# Patient Record
Sex: Female | Born: 1975 | Race: White | Hispanic: Yes | Marital: Single | Smoking: Never smoker
Health system: Southern US, Community
[De-identification: ages and names within clinical notes are randomized; demographics above are authoritative.]

---

## 1997-10-02 ENCOUNTER — Other Ambulatory Visit: Admission: RE | Admit: 1997-10-02 | Discharge: 1997-10-02 | Payer: Self-pay | Admitting: Obstetrics and Gynecology

## 2000-07-29 ENCOUNTER — Ambulatory Visit (HOSPITAL_COMMUNITY): Admission: RE | Admit: 2000-07-29 | Discharge: 2000-07-29 | Payer: Self-pay | Admitting: *Deleted

## 2000-11-22 ENCOUNTER — Inpatient Hospital Stay (HOSPITAL_COMMUNITY): Admission: AD | Admit: 2000-11-22 | Discharge: 2000-11-24 | Payer: Self-pay | Admitting: Obstetrics & Gynecology

## 2007-10-20 ENCOUNTER — Ambulatory Visit (HOSPITAL_COMMUNITY): Admission: RE | Admit: 2007-10-20 | Discharge: 2007-10-20 | Payer: Self-pay | Admitting: Family Medicine

## 2007-12-01 ENCOUNTER — Ambulatory Visit (HOSPITAL_COMMUNITY): Admission: RE | Admit: 2007-12-01 | Discharge: 2007-12-01 | Payer: Self-pay | Admitting: Family Medicine

## 2007-12-27 ENCOUNTER — Ambulatory Visit (HOSPITAL_COMMUNITY): Admission: RE | Admit: 2007-12-27 | Discharge: 2007-12-27 | Payer: Self-pay | Admitting: Family Medicine

## 2008-01-05 ENCOUNTER — Ambulatory Visit: Payer: Self-pay | Admitting: Obstetrics & Gynecology

## 2008-01-05 ENCOUNTER — Inpatient Hospital Stay (HOSPITAL_COMMUNITY): Admission: AD | Admit: 2008-01-05 | Discharge: 2008-01-07 | Payer: Self-pay | Admitting: Obstetrics & Gynecology

## 2008-01-05 ENCOUNTER — Ambulatory Visit: Payer: Self-pay | Admitting: Family Medicine

## 2009-07-11 IMAGING — US US OB DETAIL+14 WK
1 series · 14 of 28 positions shown · non-contrast
Comparison: none

OBSTETRICAL ULTRASOUND:
 This ultrasound exam was performed in the [HOSPITAL] Ultrasound Department.  The OB US report was generated in the AS system, and faxed to the ordering physician.  This report is also available in [REDACTED] PACS.

[Series 1: us ob detail+14 wk · 0.31mm/px · 73 acquisitions, 14 frames shown]
[im 3/73]
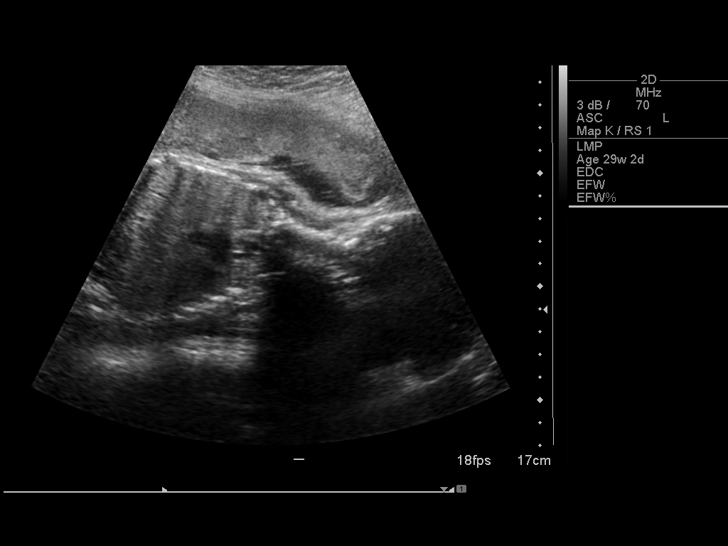
[im 9/73]
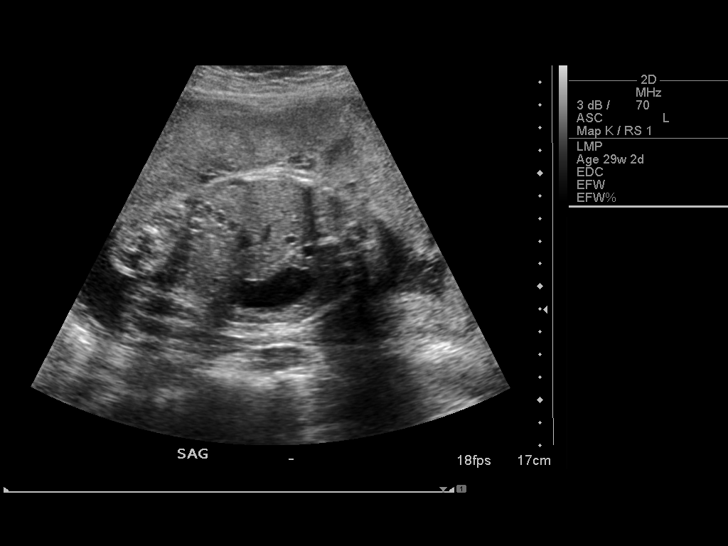
[im 14/73]
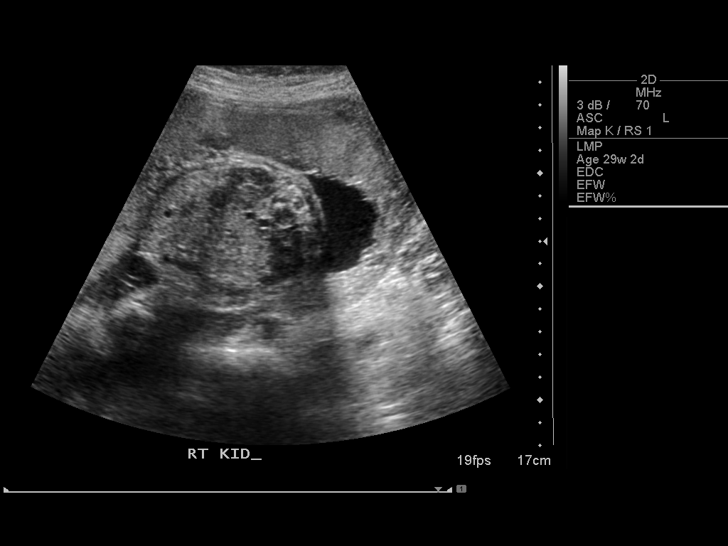
[im 19/73]
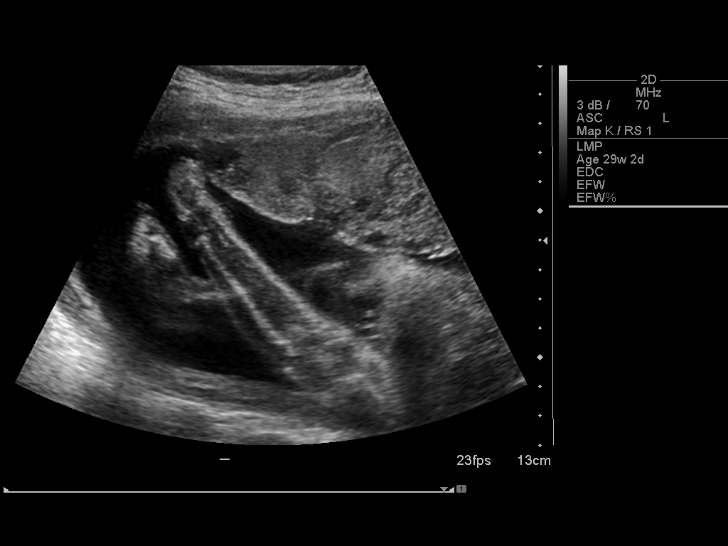
[im 25/73]
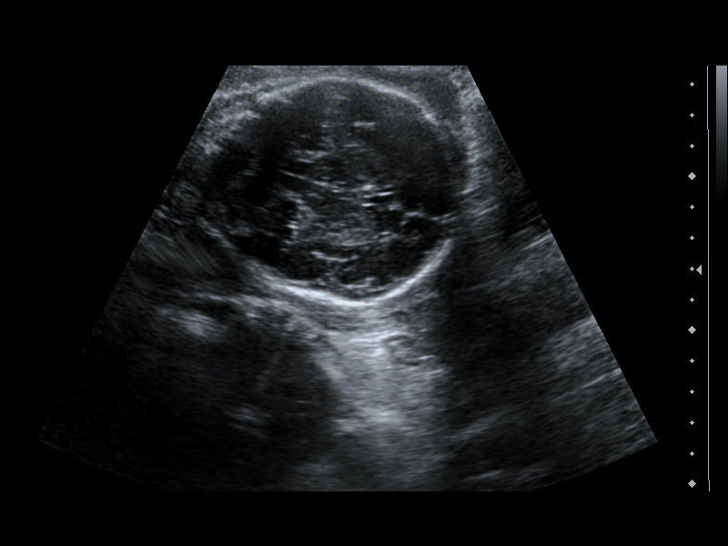
[im 30/73]
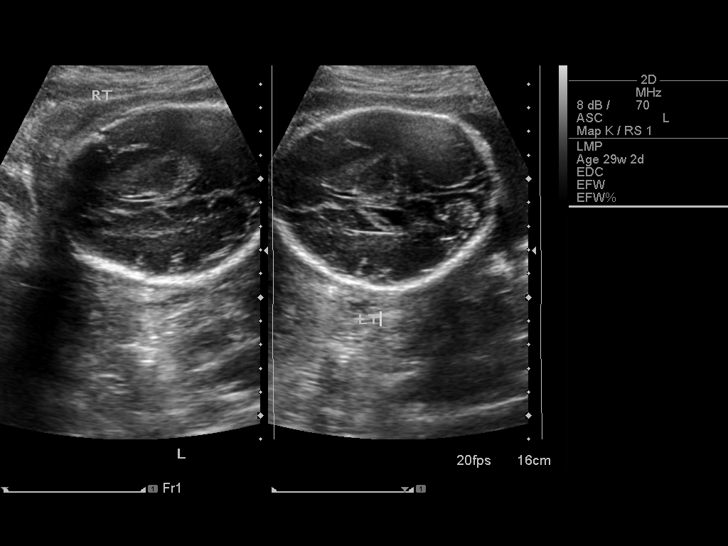
[im 35/73]
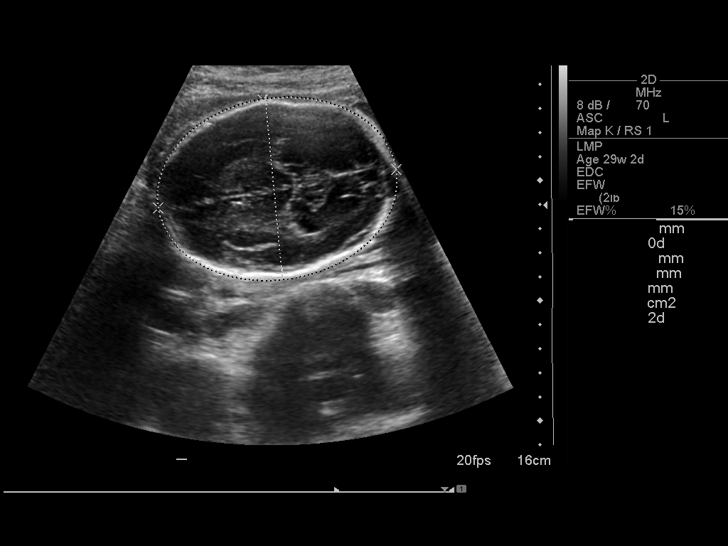
[im 41/73]
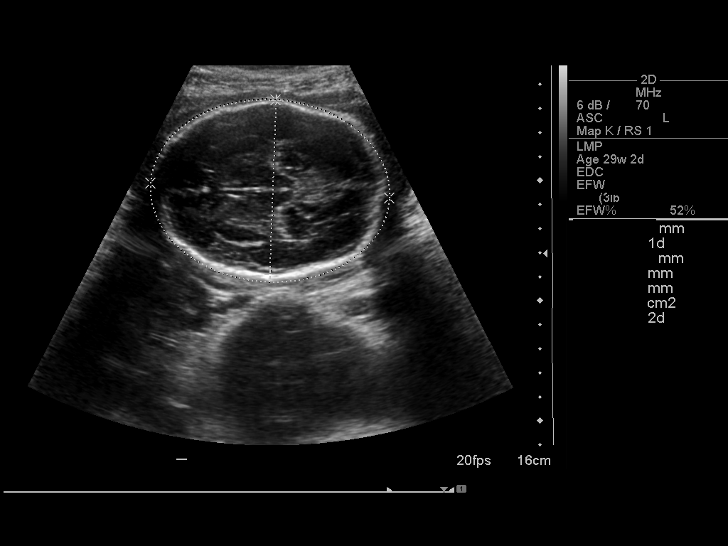
[im 46/73]
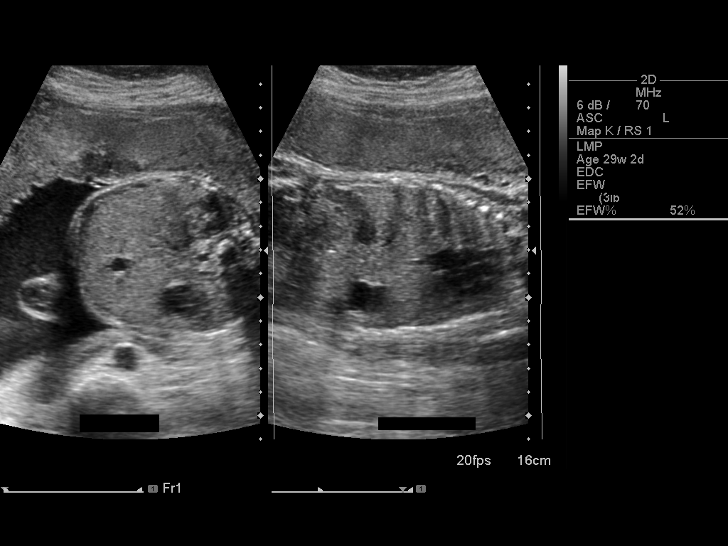
[im 51/73]
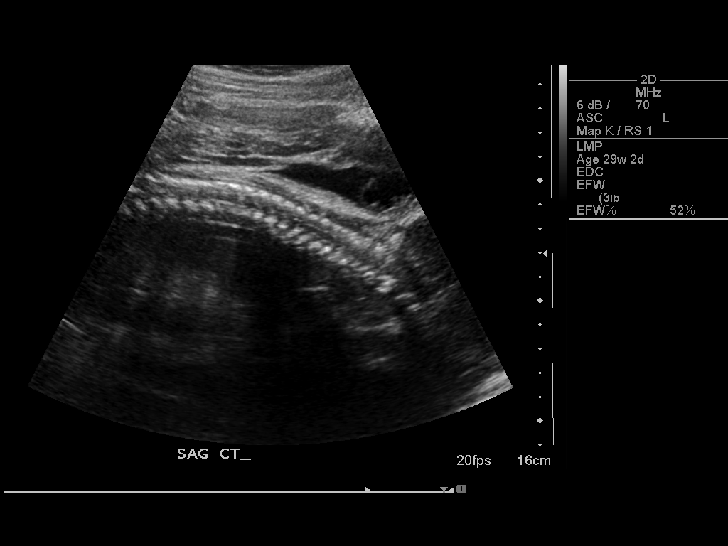
[im 57/73]
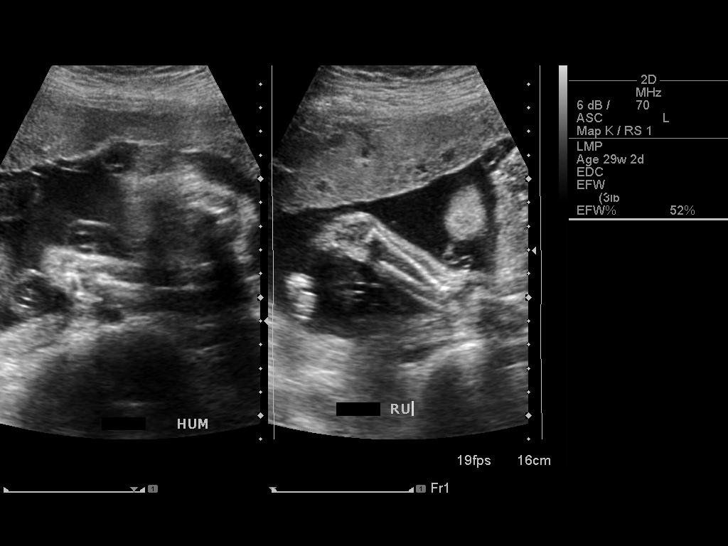
[im 62/73]
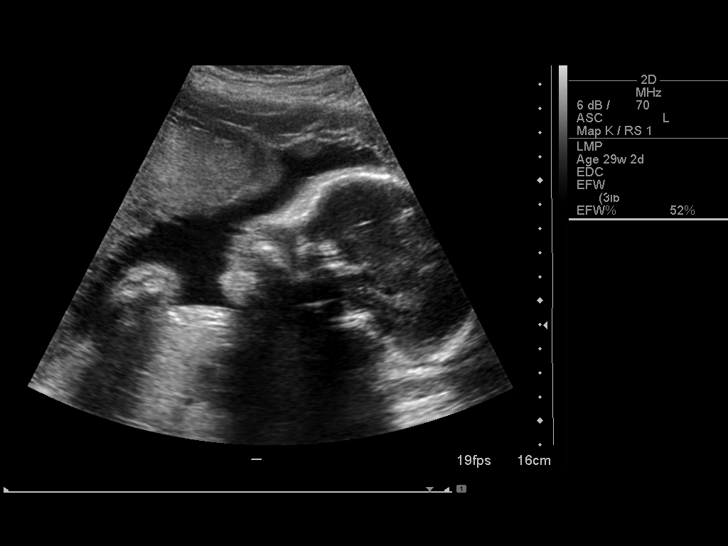
[im 67/73]
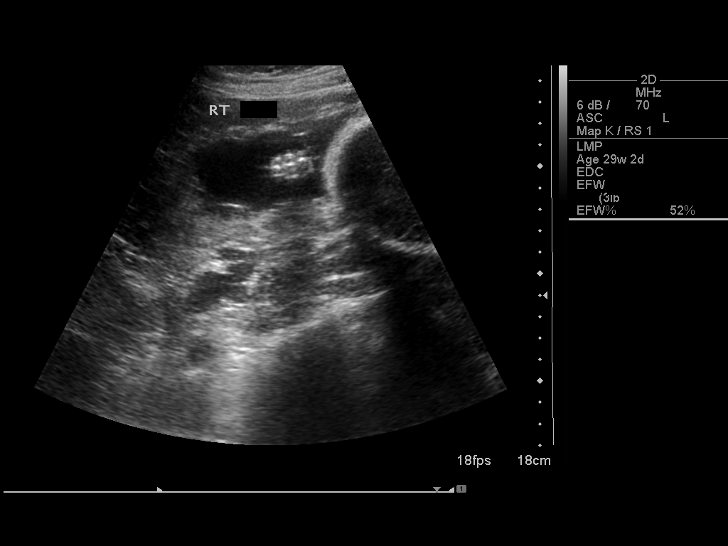
[im 73/73]
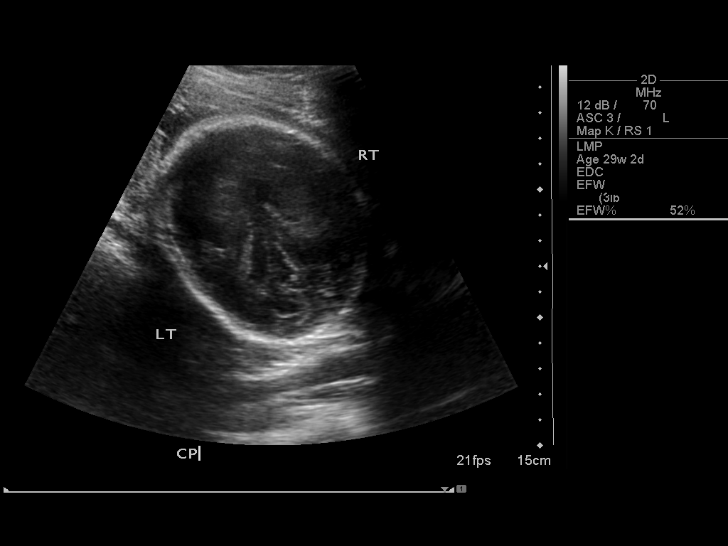

[14 of 28 positions shown; findings below may reference images not displayed]

IMPRESSION: See AS Obstetric US report.

## 2009-08-22 IMAGING — US US OB FOLLOW-UP
1 series · 14 of 26 positions shown · non-contrast
Comparison: none

OBSTETRICAL ULTRASOUND:
 This ultrasound exam was performed in the [HOSPITAL] Ultrasound Department.  The OB US report was generated in the AS system, and faxed to the ordering physician.  This report is also available in [REDACTED] PACS.

[Series 1: us ob re-eval · 14 of 26 slices shown]
[im 1/26]
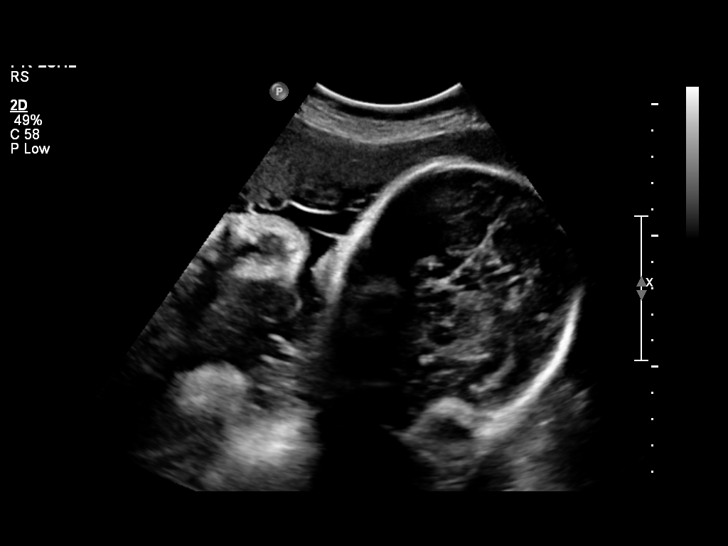
[im 3/26]
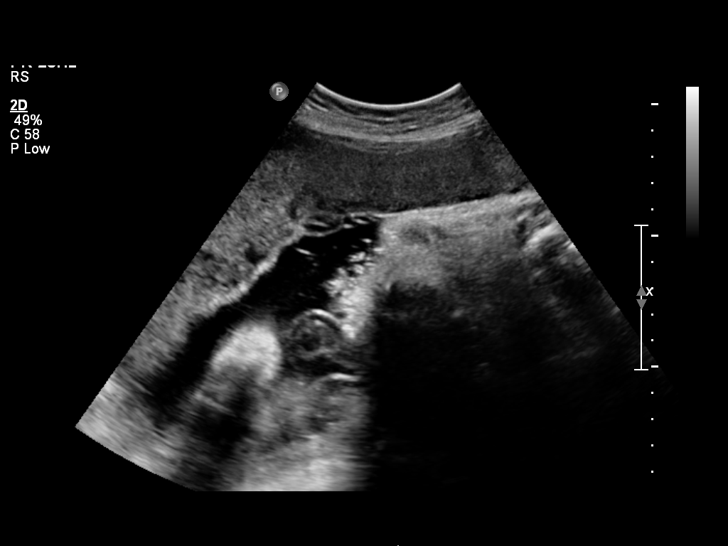
[im 5/26]
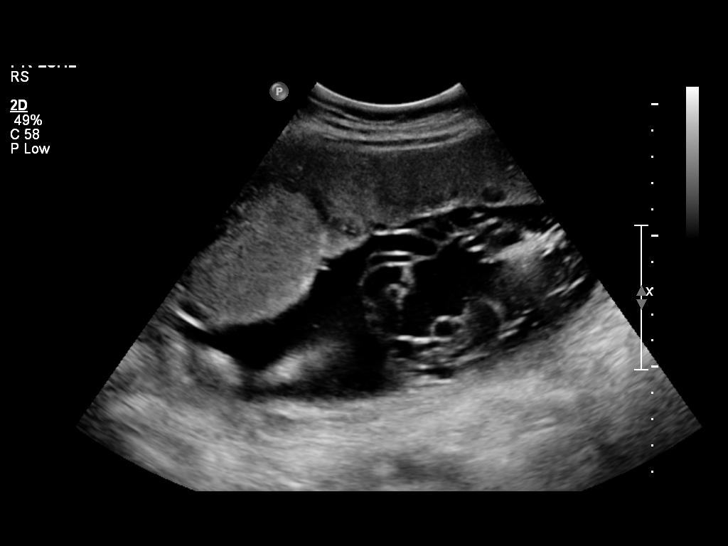
[im 7/26]
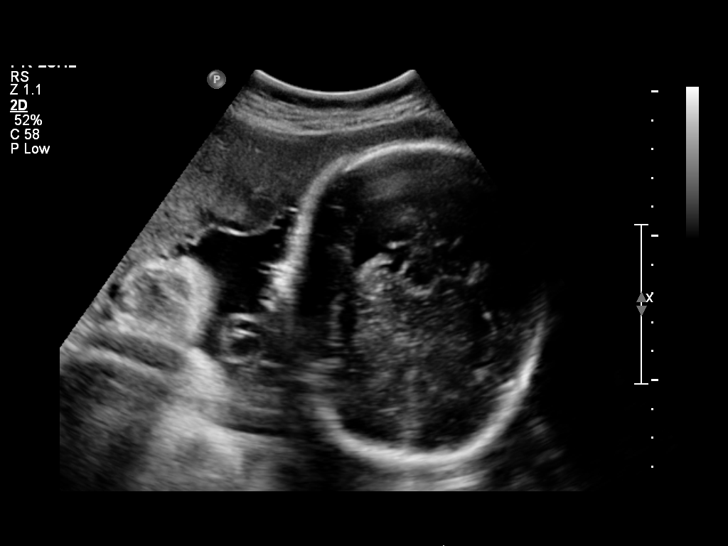
[im 9/26]
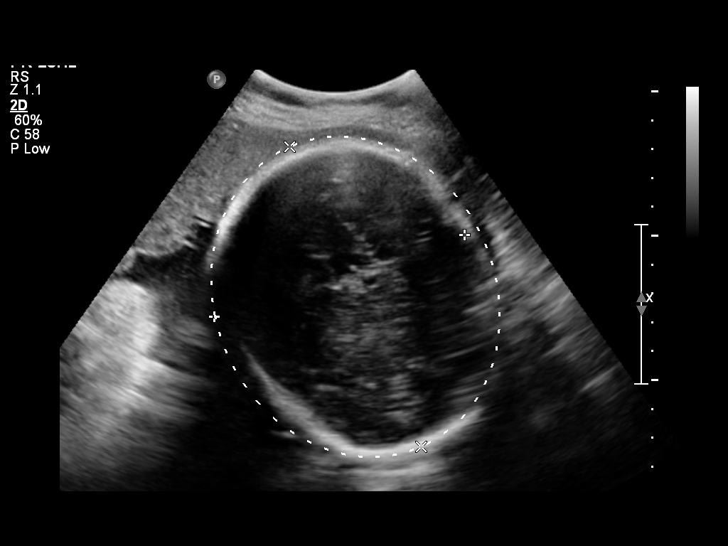
[im 11/26]
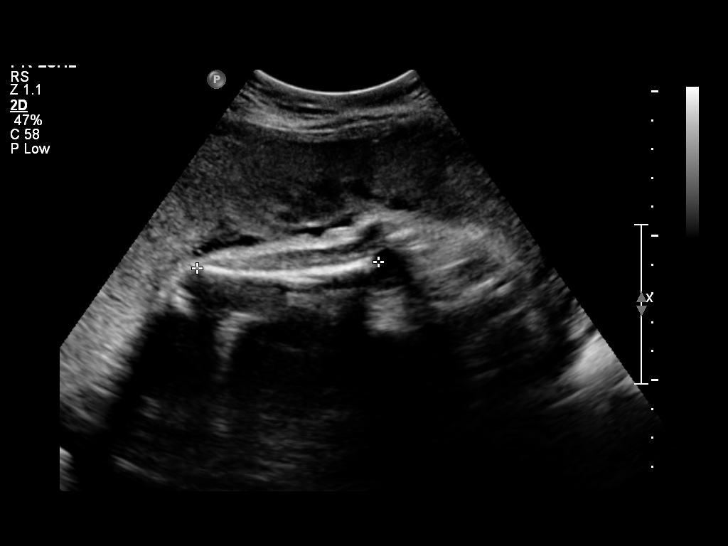
[im 13/26]
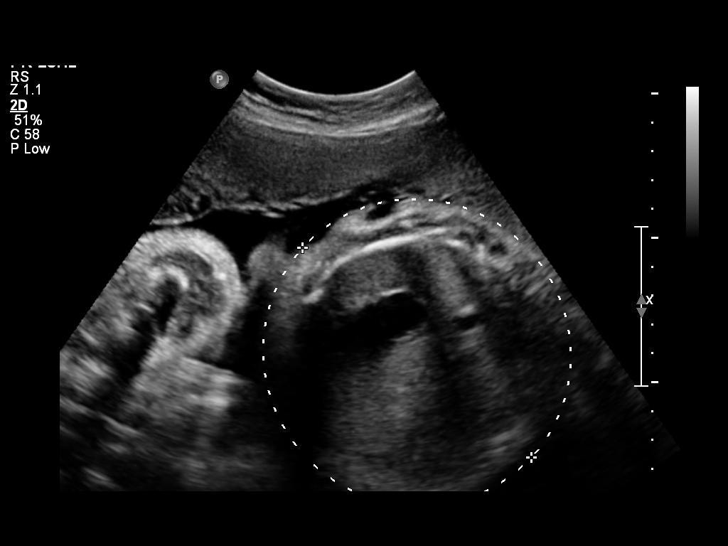
[im 14/26]
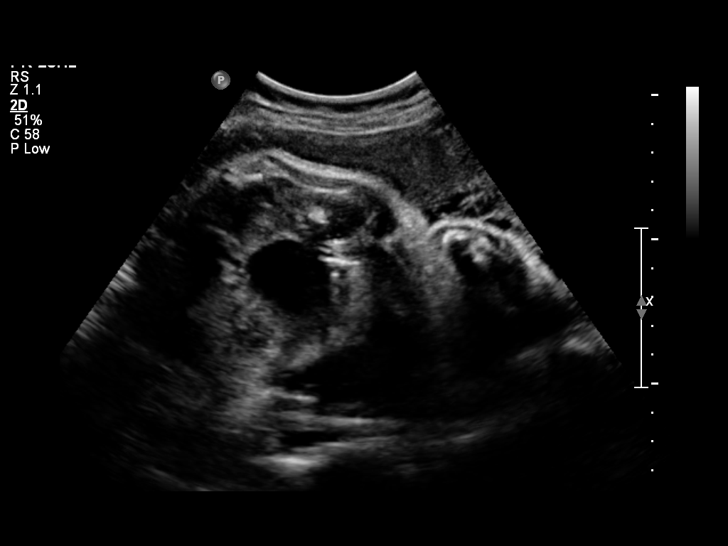
[im 16/26]
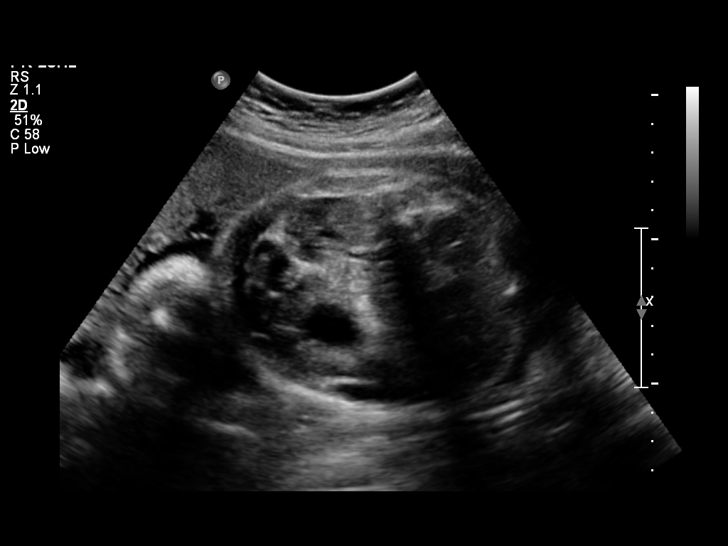
[im 18/26]
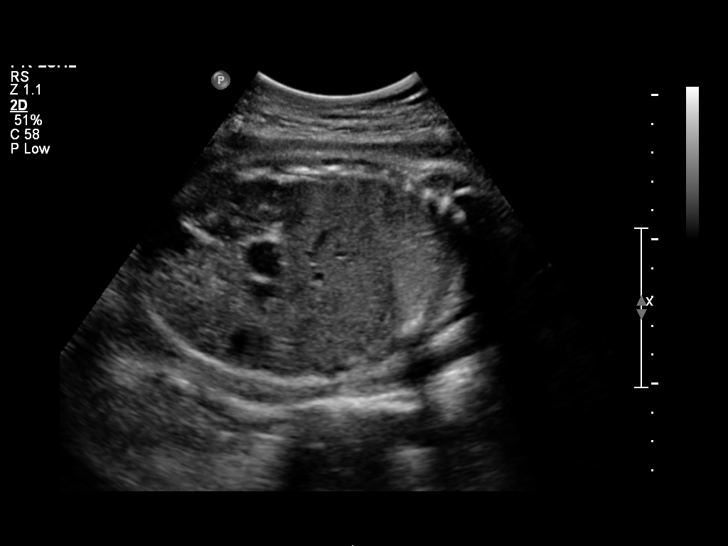
[im 20/26]
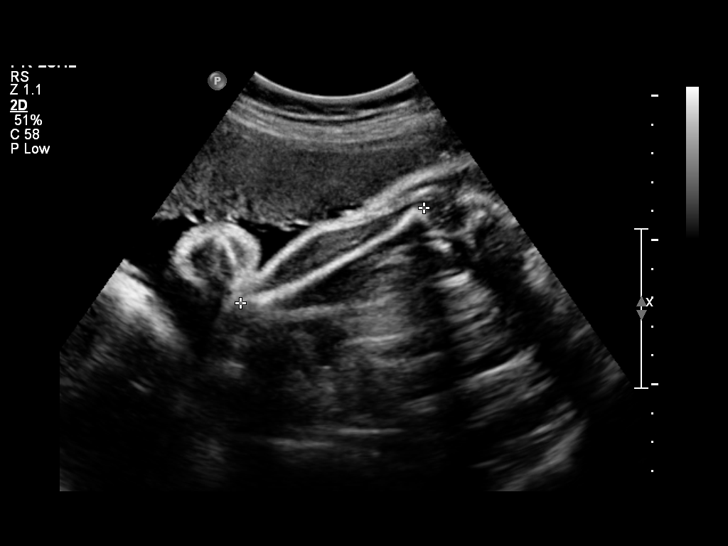
[im 22/26]
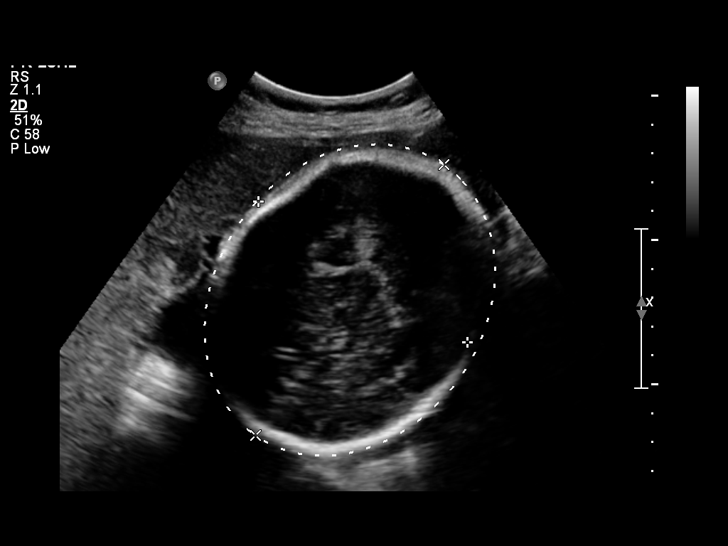
[im 24/26]
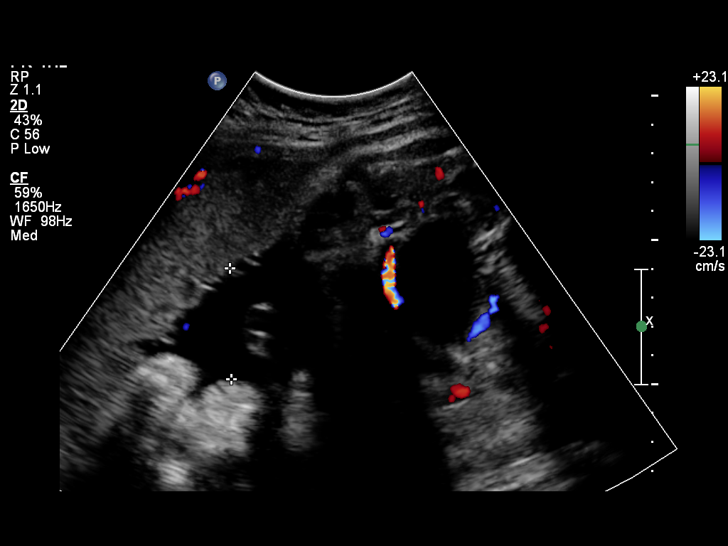
[im 26/26]
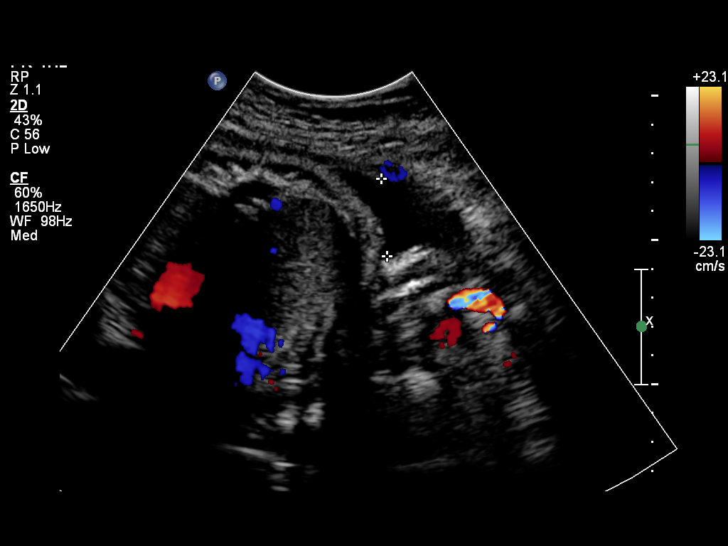

[14 of 26 positions shown; findings below may reference images not displayed]

IMPRESSION: See AS Obstetric US report.

## 2009-09-17 IMAGING — US US OB FOLLOW-UP
1 series · 14 of 24 positions shown · non-contrast
Comparison: none

OBSTETRICAL ULTRASOUND:
 This ultrasound exam was performed in the [HOSPITAL] Ultrasound Department.  The OB US report was generated in the AS system, and faxed to the ordering physician.  This report is also available in [REDACTED] PACS.

[Series 1: us ob follow-up · 0.24mm/px · 14 of 24 slices shown]
[im 1/24]
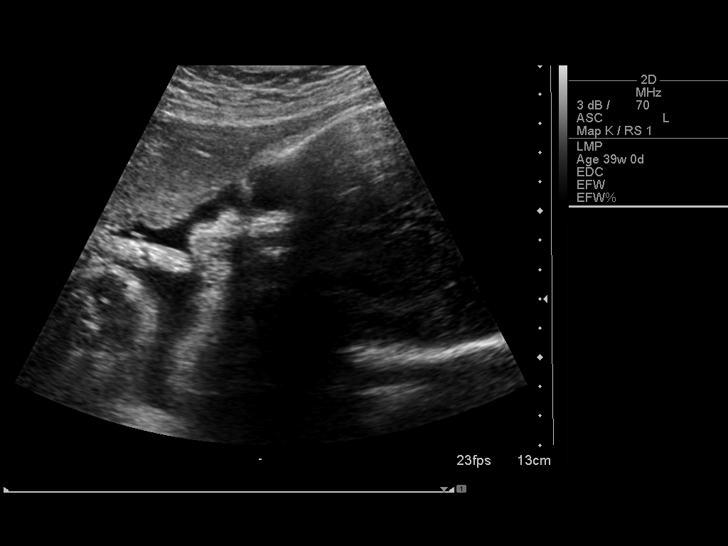
[im 3/24]
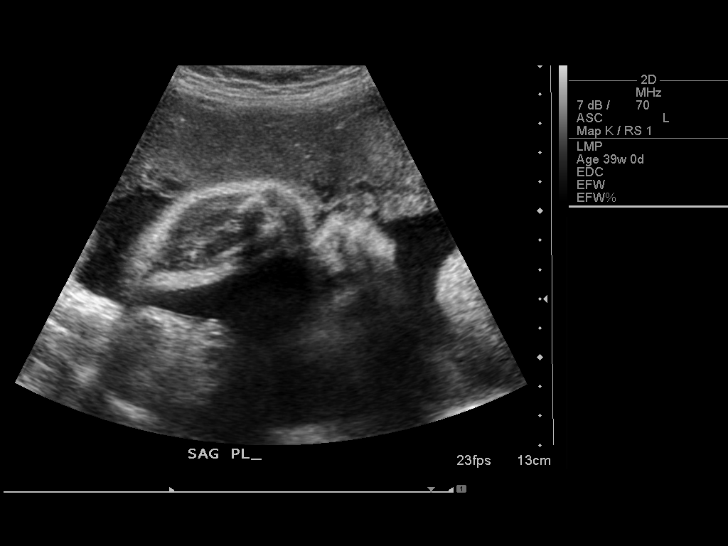
[im 5/24]
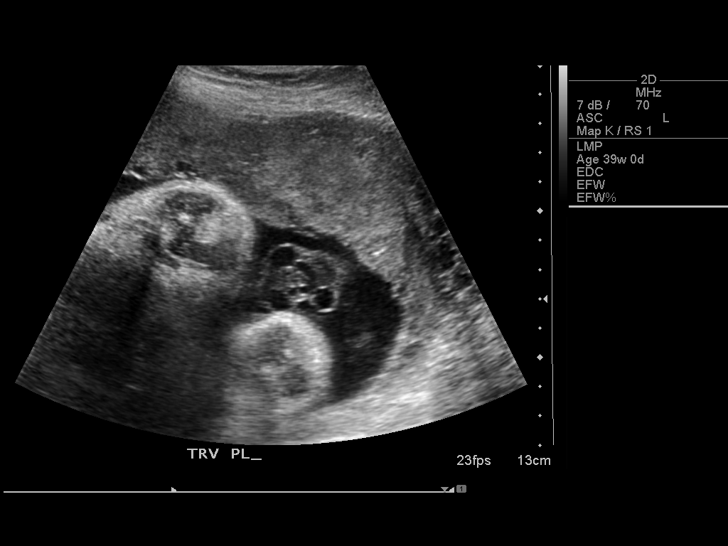
[im 7/24]
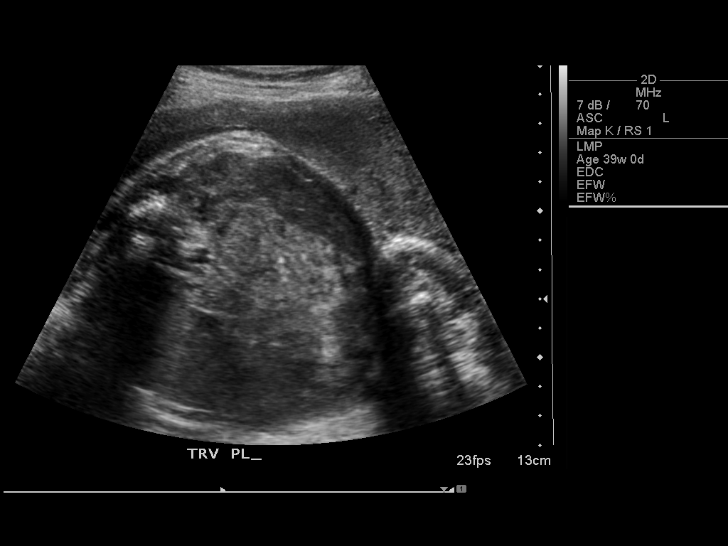
[im 8/24]
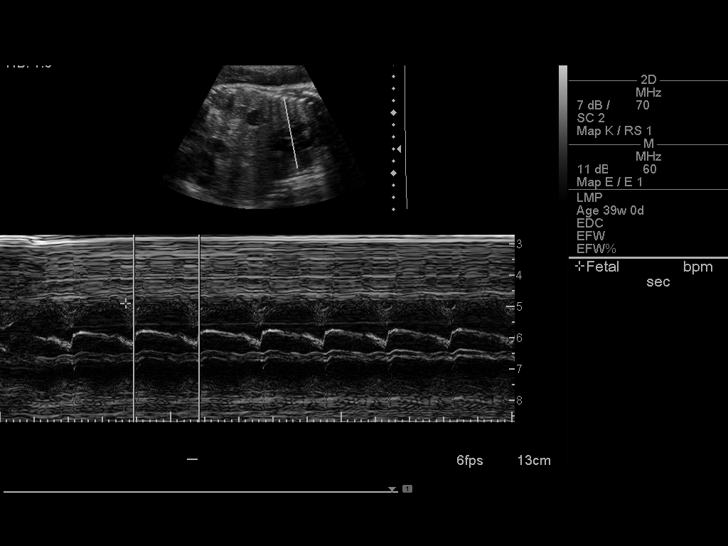
[im 10/24]
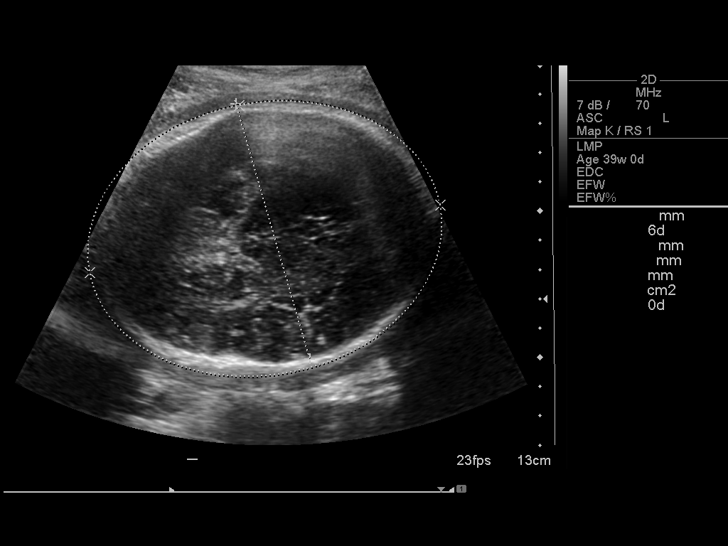
[im 12/24]
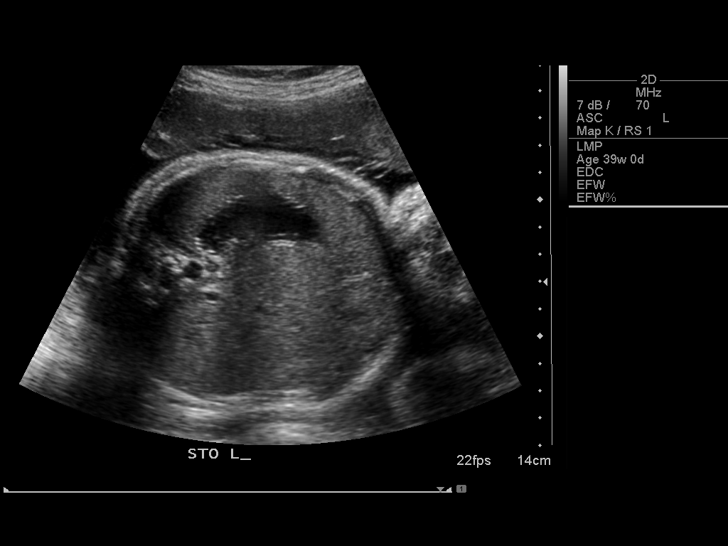
[im 13/24]
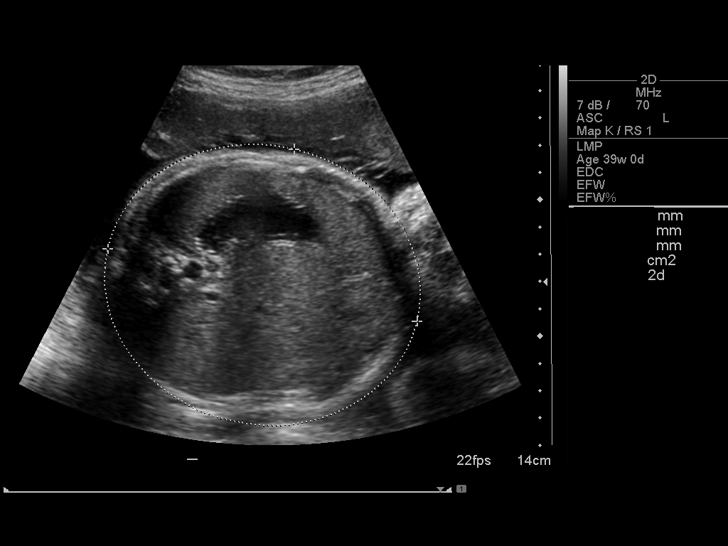
[im 15/24]
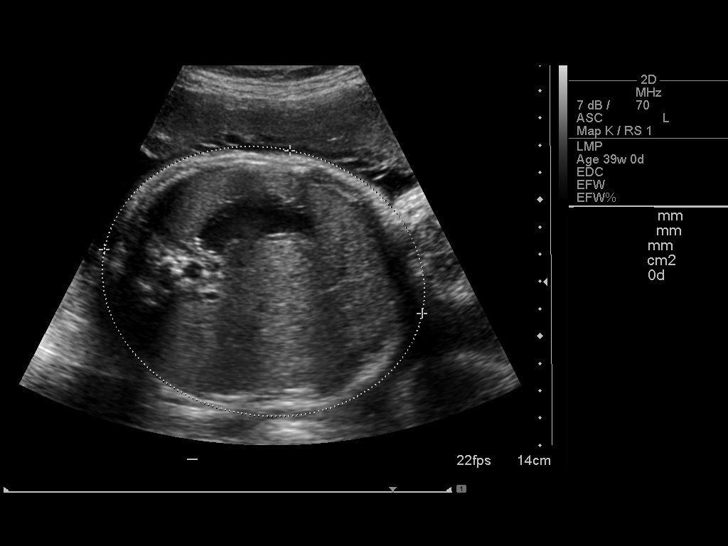
[im 17/24]
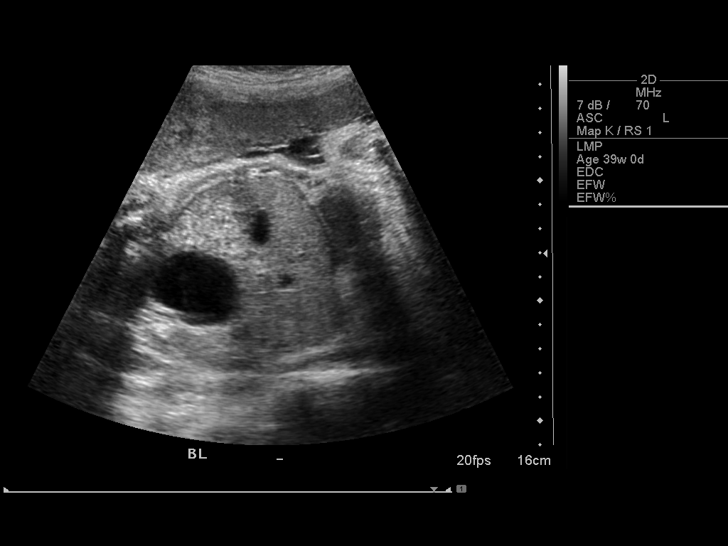
[im 19/24]
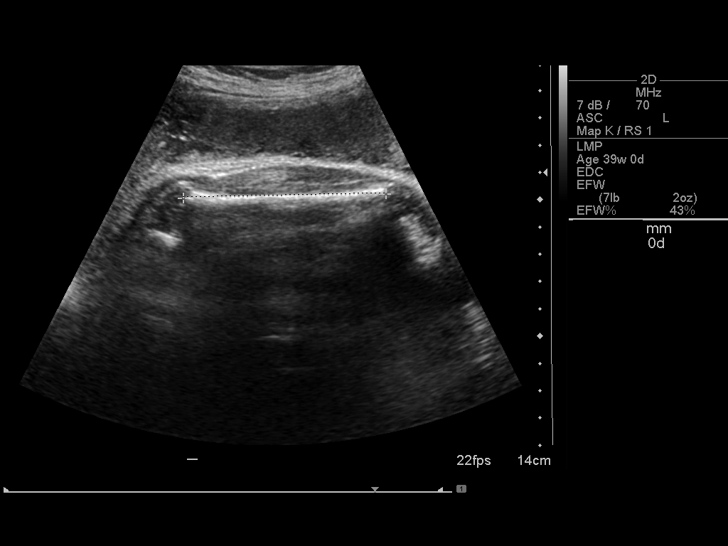
[im 20/24]
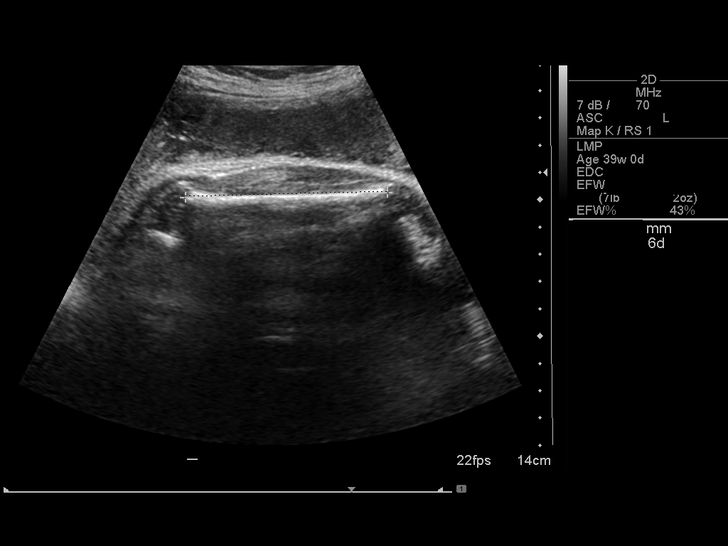
[im 22/24]
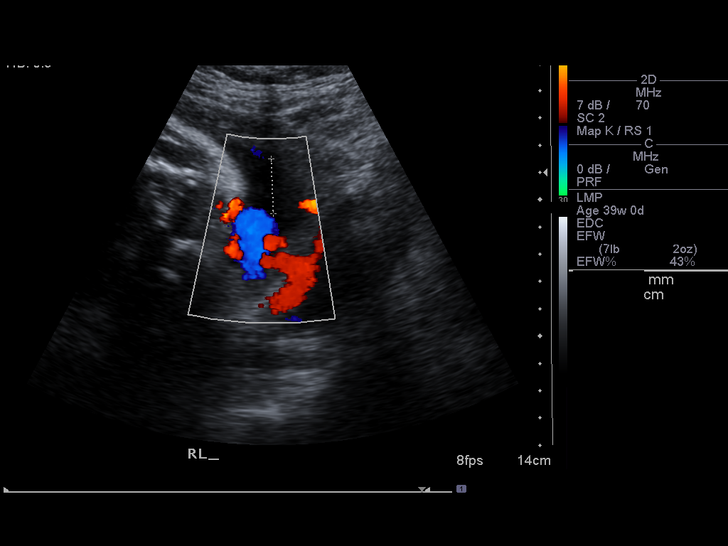
[im 24/24]
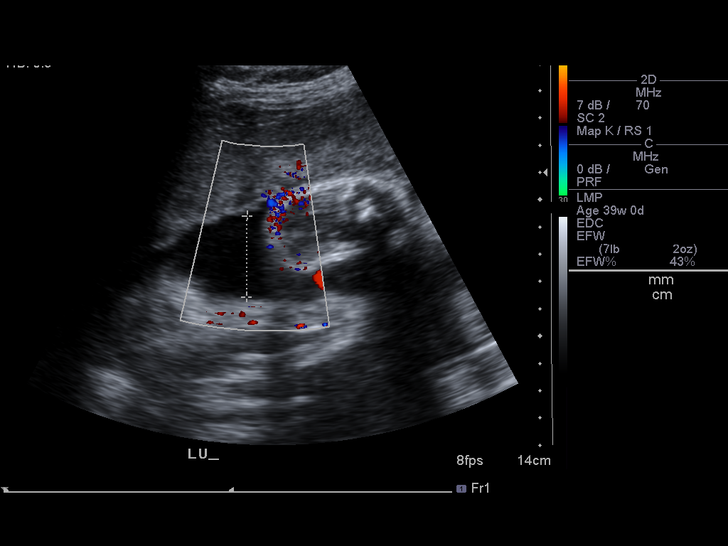

[14 of 24 positions shown; findings below may reference images not displayed]

IMPRESSION: See AS Obstetric US report.

## 2011-01-04 LAB — CBC
HCT: 35.3 — ABNORMAL LOW
HCT: 41.3
Hemoglobin: 12.2
Hemoglobin: 14
MCHC: 34
MCHC: 34.2
MCV: 95.3
MCV: 95.6
Platelets: 183
Platelets: 207
RBC: 3.7 — ABNORMAL LOW
RBC: 4.33
RDW: 12.9
RDW: 13.1
WBC: 17.8 — ABNORMAL HIGH
WBC: 9.7

## 2011-01-04 LAB — RPR: RPR Ser Ql: NONREACTIVE

## 2018-01-31 ENCOUNTER — Other Ambulatory Visit (HOSPITAL_COMMUNITY): Payer: Self-pay | Admitting: *Deleted

## 2018-01-31 DIAGNOSIS — Z1231 Encounter for screening mammogram for malignant neoplasm of breast: Secondary | ICD-10-CM

## 2018-02-06 ENCOUNTER — Other Ambulatory Visit (HOSPITAL_COMMUNITY): Payer: Self-pay | Admitting: *Deleted

## 2018-02-06 DIAGNOSIS — Z1231 Encounter for screening mammogram for malignant neoplasm of breast: Secondary | ICD-10-CM

## 2018-05-11 ENCOUNTER — Encounter (HOSPITAL_COMMUNITY): Payer: Self-pay

## 2018-05-11 ENCOUNTER — Ambulatory Visit
Admission: RE | Admit: 2018-05-11 | Discharge: 2018-05-11 | Disposition: A | Discharge status: Home / Self Care | Payer: No Typology Code available for payment source | Source: Ambulatory Visit | Attending: Obstetrics and Gynecology | Admitting: Obstetrics and Gynecology

## 2018-05-11 ENCOUNTER — Ambulatory Visit: Payer: Self-pay

## 2018-05-11 ENCOUNTER — Ambulatory Visit (HOSPITAL_COMMUNITY)
Admission: RE | Admit: 2018-05-11 | Discharge: 2018-05-11 | Disposition: A | Discharge status: Home / Self Care | Payer: Self-pay | Source: Ambulatory Visit | Attending: Obstetrics and Gynecology | Admitting: Obstetrics and Gynecology

## 2018-05-11 VITALS — BP 112/78 | Wt 157.0 lb

## 2018-05-11 DIAGNOSIS — Z1239 Encounter for other screening for malignant neoplasm of breast: Secondary | ICD-10-CM

## 2018-05-11 DIAGNOSIS — Z1231 Encounter for screening mammogram for malignant neoplasm of breast: Secondary | ICD-10-CM

## 2018-05-11 NOTE — Patient Instructions (Signed)
Explained breast self awareness with [name]. Patient did not need a Pap smear today due to last Pap smear was in May or June 2019 per patient. Let her know BCCCP will cover Pap smears every 3 years unless has a history of abnormal Pap smears. Referred patient to the [facility] Center of [facility] for a screening mammogram. Appointment scheduled for Thursday, May 11, 2018 at 1040. Patient aware of appointment and will be there. Let patient know the Breast Center will follow up with her within the next couple weeks with results of mammogram by letter or phone. [name] verbalized understanding.  Sue Gonzalez, Kathaleen Maser, RN 10:26 AM

## 2018-05-11 NOTE — Progress Notes (Signed)
No complaints today.   Pap Smear: Pap smear not completed today. Last Pap smear was May or June 2019 at the [facility] Department and normal per patient. Per patient has no history of an abnormal Pap smear. No Pap smear results are in Epic.  Physical exam: Breasts Breasts symmetrical. No skin abnormalities bilateral breasts. No nipple retraction bilateral breasts. No nipple discharge bilateral breasts. No lymphadenopathy. No lumps palpated bilateral breasts. No complaints of pain or tenderness on exam. Referred patient to the [facility] of [facility] for a screening mammogram. Appointment scheduled for Thursday, May 11, 2018 at 1040.        Pelvic/Bimanual No Pap smear completed today since last Pap smear was in May or June 2019 per patient. Pap smear not indicated per BCCCP guidelines.   Smoking History: Patient has never smoked.  Patient Navigation: Patient education provided. Access to services provided for patient through [facility] program. Spanish interpreter provided.   Breast and Cervical Cancer Risk Assessment: Patient has no family history of breast cancer, known genetic mutations, or radiation treatment to the chest before age 3. Patient has no history of cervical dysplasia, immunocompromised, or DES exposure in-utero.  Risk Assessment    Risk Scores      05/11/2018   Last edited by: Lynnell Dike, LPN   5-year risk: 0.3 %   Lifetime risk: 5.8 %         Used Spanish interpreter Natale Lay from [place].

## 2018-05-12 ENCOUNTER — Encounter (HOSPITAL_COMMUNITY): Payer: Self-pay | Admitting: *Deleted

## 2019-02-02 ENCOUNTER — Other Ambulatory Visit: Payer: Self-pay

## 2019-02-02 DIAGNOSIS — Z20822 Contact with and (suspected) exposure to covid-19: Secondary | ICD-10-CM

## 2019-02-03 LAB — NOVEL CORONAVIRUS, NAA: SARS-CoV-2, NAA: NOT DETECTED

## 2019-02-17 ENCOUNTER — Other Ambulatory Visit: Payer: Self-pay | Admitting: *Deleted

## 2019-02-17 DIAGNOSIS — Z20822 Contact with and (suspected) exposure to covid-19: Secondary | ICD-10-CM

## 2019-02-20 LAB — NOVEL CORONAVIRUS, NAA: SARS-CoV-2, NAA: NOT DETECTED

## 2019-06-29 ENCOUNTER — Telehealth: Payer: Self-pay

## 2019-07-02 ENCOUNTER — Other Ambulatory Visit: Payer: Self-pay

## 2019-07-02 DIAGNOSIS — Z1231 Encounter for screening mammogram for malignant neoplasm of breast: Secondary | ICD-10-CM

## 2019-07-06 NOTE — Telephone Encounter (Signed)
Error

## 2019-07-26 ENCOUNTER — Ambulatory Visit: Payer: Self-pay | Admitting: Medical

## 2019-07-26 ENCOUNTER — Encounter: Payer: Self-pay | Admitting: Medical

## 2019-07-26 ENCOUNTER — Ambulatory Visit
Admission: RE | Admit: 2019-07-26 | Discharge: 2019-07-26 | Disposition: A | Discharge status: Home / Self Care | Payer: Self-pay | Source: Ambulatory Visit | Attending: Obstetrics and Gynecology | Admitting: Obstetrics and Gynecology

## 2019-07-26 ENCOUNTER — Other Ambulatory Visit: Payer: Self-pay

## 2019-07-26 VITALS — BP 156/74 | Temp 97.5°F | Wt 156.0 lb

## 2019-07-26 DIAGNOSIS — Z1231 Encounter for screening mammogram for malignant neoplasm of breast: Secondary | ICD-10-CM

## 2019-07-26 NOTE — Progress Notes (Signed)
[name] is a 44 y.o. female who presents to [facility] clinic today with no complaints.    Pap Smear: Pap not smear completed today. Last Pap smear was 10/2016 at [facility] and was normal. Per patient has no history of an abnormal Pap smear. Last Pap smear result is not available in Epic.   Physical exam: Breasts Breasts with mild asymmetry, right larger than left. No skin abnormalities bilateral breasts. No nipple retraction bilateral breasts. No nipple discharge bilateral breasts. No lymphadenopathy. No lumps palpated bilateral breasts.       Pelvic/Bimanual Pap is not indicated today    Smoking History: Patient has never smoked.     Patient Navigation: Patient education provided. Access to services provided for patient through [facility] program. Interpreter provided.    Colorectal Cancer Screening: Per patient has never had colonoscopy completed No complaints today.    Breast and Cervical Cancer Risk Assessment: Patient does not have family history of breast cancer, known genetic mutations, or radiation treatment to the chest before age 43. Patient does not have history of cervical dysplasia, immunocompromised, or DES exposure in-utero.  Risk Assessment    Risk Scores      07/26/2019 05/11/2018   Last edited by: Sue Rutherford, Sue Gonzalez Sue Bang Gonzalez, Sue Gonzalez   5-year risk: 0.4 % 0.3 %   Lifetime risk: 5.7 % 5.8 %          A: BCCCP exam without pap smear  P: Referred patient to the [facility] Center of [facility] for a screening mammogram. Appointment scheduled 07/26/19 @ 1210pm.  Sue Lowenstein, PA-C 07/26/2019 11:14 AM

## 2019-07-26 NOTE — Patient Instructions (Signed)
Mamografa Mammogram Una mamografa es un radiografa de las mamas que se realiza para determinar si hay cambios que no son normales. Este estudio permite explorar y encontrar cualquier cambio que pudiera sugerir la presencia de cncer de mama. Las mamografas se realizan peridicamente en las mujeres. Un hombre puede hacerse una mamografa si tiene un bulto o hinchazn en la mama. Tambin puede ayudar a identificar otros cambios y variaciones en las mamas. Informe al mdico:  Acerca de cualquier alergia que tenga.  Si tiene implantes mamarios.  Si ha tenido enfermedades, biopsias o cirugas previas de la mama.  Si est amamantando.  Si es menor de 25aos.  Si tiene antecedentes familiares de cncer de mama.  Si est embarazada o podra estarlo. Cules son los riesgos? En general, se trata de un procedimiento seguro. Sin embargo, pueden ocurrir complicaciones, por ejemplo:  Exposicin a la radiacin. En este estudio, los niveles de radiacin son muy bajos.  La interpretacin errnea de los resultados.  La necesidad de realizar ms estudios.  La imposibilidad de la mamografa de detectar algunos tipos de cncer. Qu ocurre antes del procedimiento?  Hgase este estudio aproximadamente 1 o 2semanas despus de la menstruacin. Generalmente, este es el momento en que las mamas estn menos sensibles.  Si consulta a un mdico nuevo o cambia de clnica, enve las mamografas anteriores al nuevo consultorio.  El da del estudio, lvese las mamas y las axilas.  No use desodorantes, perfumes, lociones o talcos el da del estudio.  Qutese las alhajas del cuello.  Use prendas que pueda ponerse y sacarse fcilmente. Qu ocurre durante el procedimiento?   Se quitar la ropa de la cintura para arriba. Se colocar una bata.  Debe permanecer de pie delante de la mquina de rayos-X.  Se colocar cada mama entre dos placas de vidrio o de plstico. Las placas comprimirn las mamas  durante unos segundos. Intente estar lo ms relajada posible. Esto no causa ningn dao a las mamas. Si siente alguna molestia, ser pasajera.  Se tomarn radiografas desde diferentes ngulos de cada mama. Este procedimiento puede variar segn el mdico y el hospital. Qu ocurre despus del procedimiento?  La mamografa ser leda por un especialista (radilogo).  Tal vez deba repetir algunas partes del estudio. Esto depende de la calidad de las imgenes.  Pregunte la fecha en que los resultados estarn disponibles. Asegrese de obtener los resultados.  Puede volver a sus actividades habituales. Resumen  Una mamografa es un radiografa de baja energa de las mamas que se realiza para determinar si hay cambios anormales. Un hombre puede hacerse este examen si tiene un bulto o hinchazn en la mama.  Antes del procedimiento, informe al mdico sobre cualquier problema en las mamas que haya tenido en el pasado.  Hgase este estudio aproximadamente 1 o 2semanas despus de la menstruacin.  Para el examen, se colocar cada mama entre dos placas de vidrio o de plstico. Las placas comprimirn las mamas durante unos segundos.  La mamografa ser leda por un especialista (radilogo). Pregunte la fecha en que los resultados estarn disponibles. Asegrese de obtener los resultados. Esta informacin no tiene como fin reemplazar el consejo del mdico. Asegrese de hacerle al mdico cualquier pregunta que tenga. Document Revised: 12/20/2017 Document Reviewed: 12/20/2017 Elsevier Patient Education  2020 Elsevier Inc.  

## 2020-04-25 ENCOUNTER — Other Ambulatory Visit: Payer: No Typology Code available for payment source

## 2020-04-25 ENCOUNTER — Encounter: Payer: Self-pay | Admitting: Internal Medicine

## 2020-04-25 DIAGNOSIS — Z20822 Contact with and (suspected) exposure to covid-19: Secondary | ICD-10-CM

## 2020-04-27 ENCOUNTER — Telehealth: Payer: Self-pay

## 2020-04-27 LAB — SARS-COV-2, NAA 2 DAY TAT

## 2020-04-27 LAB — NOVEL CORONAVIRUS, NAA: SARS-CoV-2, NAA: DETECTED — AB

## 2020-04-27 NOTE — Telephone Encounter (Signed)
Pt name and DOB incorrect on lab results. Called [facility] and was advised to call back tomorrow with fax number for [facility] (that is the location pt had covid test). Advised that there must be a new requisition filled out and signed by a physician.  New chart made. Spoke with Drema Balzarine at [facility].  Verbal results for covid result was "detected. Read back results and correct.  Attempted to call pt back and LM on VM and call back number provided. Verified address and address is correct and the same on all the charts.  Using interpreter # [id number], Patient notified of positive COVID-19 test results. Pt verbalized understanding. Pt reports symptoms of dry cough. Criteria for self-isolation if you test positive for COVID-19, regardless of vaccination status:  -If you have mild symptoms that are resolving or have resolved, isolate at home for 5 days since symptoms started AND continue to wear a well-fitted mask when around others in the home and in public for 5 additional days after isolation is completed -If you have a fever and/or moderate to severe symptoms, isolate for at least 10 days since the symptoms started AND until you are fever free for at least 24 hours without the use of fever-reducing medications -If you tested positive and did not have symptoms, isolate for at least 5 days after your positive test  Use over-the-counter medications for symptoms.If you develop respiratory issues/distress, seek medical care in the Emergency Department.  If you must leave home or if you have to be around others please wear a mask. Please limit contact with immediate family members in the home, practice social distancing, frequent handwashing and clean hard surfaces touched frequently with household cleaning products. Members of your household will also need to quarantine and test.You may also be contacted by the health department for follow up. [facility] Department notified.

## 2021-04-27 NOTE — Progress Notes (Signed)
[facility] covid testing.

## 2021-04-29 ENCOUNTER — Other Ambulatory Visit: Payer: Self-pay

## 2021-04-29 DIAGNOSIS — Z1231 Encounter for screening mammogram for malignant neoplasm of breast: Secondary | ICD-10-CM

## 2021-04-30 ENCOUNTER — Other Ambulatory Visit: Payer: Self-pay

## 2021-04-30 DIAGNOSIS — Z1231 Encounter for screening mammogram for malignant neoplasm of breast: Secondary | ICD-10-CM

## 2021-04-30 NOTE — Addendum Note (Signed)
Addended by: Narda Rutherford on: 04/30/2021 03:33 PM   Modules accepted: Orders

## 2021-07-02 ENCOUNTER — Ambulatory Visit: Payer: Self-pay | Admitting: *Deleted

## 2021-07-02 ENCOUNTER — Ambulatory Visit
Admission: RE | Admit: 2021-07-02 | Discharge: 2021-07-02 | Disposition: A | Discharge status: Home / Self Care | Payer: No Typology Code available for payment source | Source: Ambulatory Visit | Attending: Obstetrics & Gynecology | Admitting: Obstetrics & Gynecology

## 2021-07-02 VITALS — BP 120/80 | Wt 161.1 lb

## 2021-07-02 DIAGNOSIS — Z1211 Encounter for screening for malignant neoplasm of colon: Secondary | ICD-10-CM

## 2021-07-02 DIAGNOSIS — Z01419 Encounter for gynecological examination (general) (routine) without abnormal findings: Secondary | ICD-10-CM

## 2021-07-02 DIAGNOSIS — Z1231 Encounter for screening mammogram for malignant neoplasm of breast: Secondary | ICD-10-CM

## 2021-07-02 NOTE — Patient Instructions (Signed)
Explained breast self awareness with Sue KussmaulFenton Malling.  Pap smear completed today. Let her know BCCCP will cover Pap smears and HPV typing every 5 years unless has a history of abnormal Pap smears. Referred patient to the Breast Center of [facility] for a screening mammogram on the mobile unit. Appointment scheduled Thursday, July 02, 2021 at 0930. Patient aware of appointment and will be there. Let patient know will follow up with her within the next couple weeks with results of her Pap smear by phone. Informed patient that the Breast Center will follow up with her within the next couple of weeks with results of her mammogram by letter or phone. Sue KussmaulFenton Malling verbalized understanding. ? ?Sue Gonzalez, Sue Maser, RN ?8:45 AM ? ? ? ? ?

## 2021-07-02 NOTE — Progress Notes (Addendum)
[name] is a 46 y.o. G3P0010 female who presents to [facility] clinic today with no complaints.  ?  ?Pap Smear: Pap smear completed today. Last Pap smear was 09/09/2017 at the [facility] clinic and was normal. Per patient has no history of an abnormal Pap smear. Last Pap smear result is available in Epic. ?  ?Physical exam: ?Breasts ?Breasts symmetrical. No skin abnormalities bilateral breasts. No nipple retraction bilateral breasts. No nipple discharge bilateral breasts. No lymphadenopathy. No lumps palpated bilateral breasts. No complaints of pain or tenderness on exam.     ? ?MS DIGITAL SCREENING TOMO BILATERAL ? ?Result Date: 07/26/2019 ?CLINICAL DATA:  Screening. EXAM: DIGITAL SCREENING BILATERAL MAMMOGRAM WITH TOMO AND CAD COMPARISON:  Previous exam(s). ACR Breast Density Category c: The breast tissue is heterogeneously dense, which may obscure small masses. FINDINGS: There are no findings suspicious for malignancy. Images were processed with CAD. IMPRESSION: No mammographic evidence of malignancy. A result letter of this screening mammogram will be mailed directly to the patient. RECOMMENDATION: Screening mammogram in one year. (Code:SM-B-01Y) BI-RADS CATEGORY  1: Negative. Electronically Signed   By: Edwin Cap M.D.   On: 07/26/2019 13:47  ? ?MS DIGITAL SCREENING TOMO BILATERAL ? ?Result Date: 05/11/2018 ?CLINICAL DATA:  Screening. EXAM: DIGITAL SCREENING BILATERAL MAMMOGRAM WITH TOMO AND CAD COMPARISON:  None. ACR Breast Density Category c: The breast tissue is heterogeneously dense, which may obscure small masses FINDINGS: There are no findings suspicious for malignancy. Images were processed with CAD. IMPRESSION: No mammographic evidence of malignancy. A result letter of this screening mammogram will be mailed directly to the patient. RECOMMENDATION: Screening mammogram in one year. (Code:SM-B-01Y) BI-RADS CATEGORY  1: Negative. Electronically Signed   By: Gerome Sam III M.D   On: 05/11/2018 17:15   ?  ?Pelvic/Bimanual ?Ext Genitalia ?No lesions, no swelling and no discharge observed on external genitalia.      ?  ?Vagina ?Vagina pink and normal texture. No lesions or discharge observed in vagina.      ?  ?Cervix ?Cervix is present. Cervix pink and of normal texture. No discharge observed.  ?  ?Uterus ?Uterus is present and palpable. Uterus in normal position and normal size.      ?  ?Adnexae ?Bilateral ovaries present and palpable. No tenderness on palpation.       ?  ?Rectovaginal ?No rectal exam completed today since patient had no rectal complaints. No skin abnormalities observed on exam.   ?  ?Smoking History: ?Patient has never smoked. ? ?Patient Navigation: ?Patient education provided. Access to services provided for patient through [facility] program. Spanish interpreter Natale Lay from [facility] provided.  ? ?Colorectal Cancer Screening: ?Per patient has never had colonoscopy completed. FIT Test given to patient to complete. No complaints today.  ?  ?Breast and Cervical Cancer Risk Assessment: ?Patient does not have family history of breast cancer, known genetic mutations, or radiation treatment to the chest before age 36. Patient does not have history of cervical dysplasia, immunocompromised, or DES exposure in-utero. ? ?Risk Assessment   ? ? Risk Scores   ? ?   07/02/2021 07/26/2019  ? Last edited by: Narda Rutherford, LPN McGill, Fidel Levy, LPN  ? 5-year risk: 0.4 % 0.4 %  ? Lifetime risk: 5.6 % 5.7 %  ? ?  ?  ? ?  ? ? ?A: ?BCCCP exam with pap smear ?No complaints. ? ?P: ?Referred patient to the [facility] Center of [facility] for a screening  mammogram on the mobile unit. Appointment scheduled Thursday, July 02, 2021 at 0930. ? ?Priscille Heidelberg, RN ?07/02/2021 8:45 AM   ?

## 2021-07-03 LAB — CYTOLOGY - PAP
Comment: NEGATIVE
Diagnosis: NEGATIVE
High risk HPV: NEGATIVE

## 2021-07-09 ENCOUNTER — Telehealth: Payer: Self-pay

## 2021-07-09 NOTE — Telephone Encounter (Signed)
Via Erika McReynolds, Spanish Interpreter ([employer]), Patient informed negative Pap/HPV results, next pap due in 5 years. Patient verbalized understanding.  

## 2021-10-02 ENCOUNTER — Ambulatory Visit: Payer: No Typology Code available for payment source | Admitting: Medical

## 2023-03-24 IMAGING — MG MM DIGITAL SCREENING BILAT W/ TOMO AND CAD
8 series · 9 of 24 positions shown · non-contrast
Comparison: Previous exam(s).

CLINICAL DATA: Screening.

EXAM:
DIGITAL SCREENING BILATERAL MAMMOGRAM WITH TOMOSYNTHESIS AND CAD
TECHNIQUE: Bilateral screening digital craniocaudal and mediolateral oblique
mammograms were obtained. Bilateral screening digital breast
tomosynthesis was performed. The images were evaluated with
computer-aided detection.

[R MLO synth-2D]
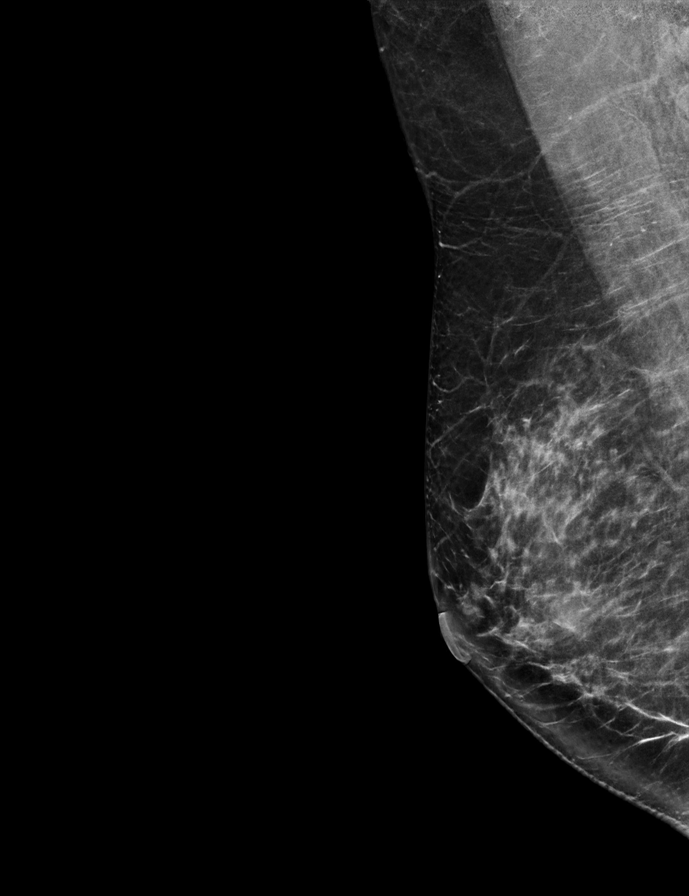

[R CC synth-2D]
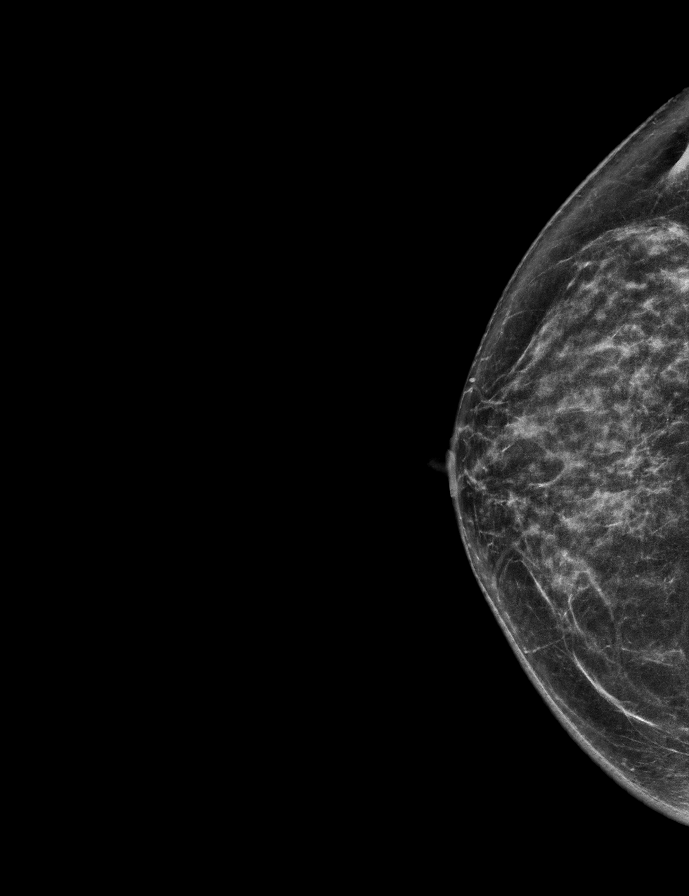

[L MLO synth-2D]
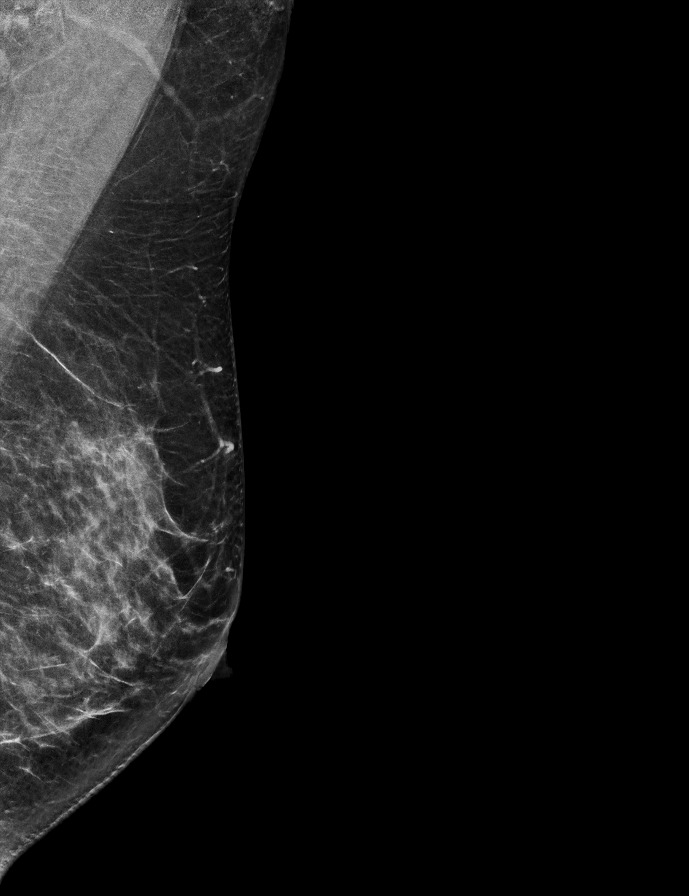

[L CC synth-2D]
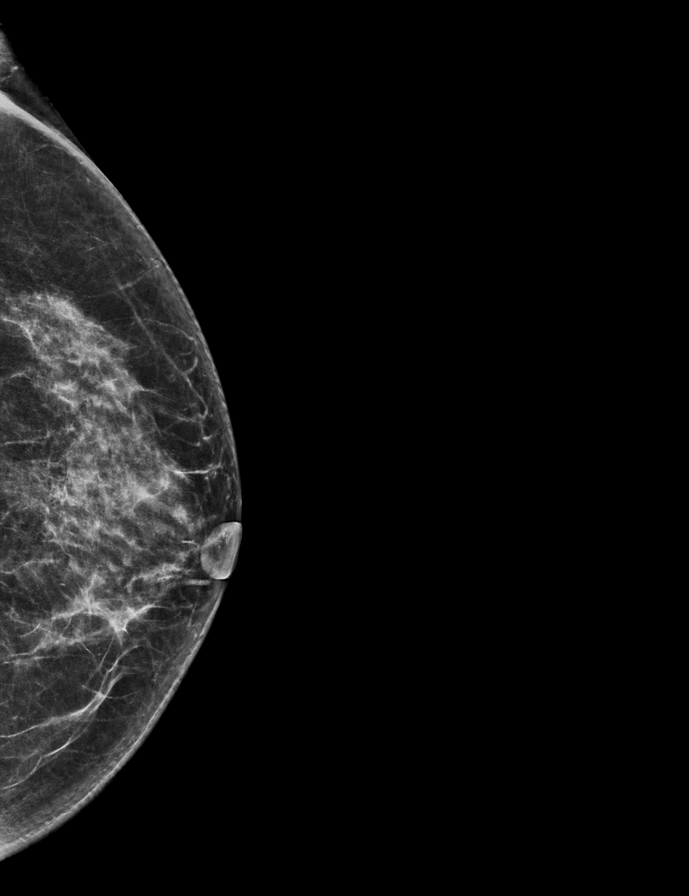

[R CC tomo · 2 of 62 frames shown]
[frame 21/62]
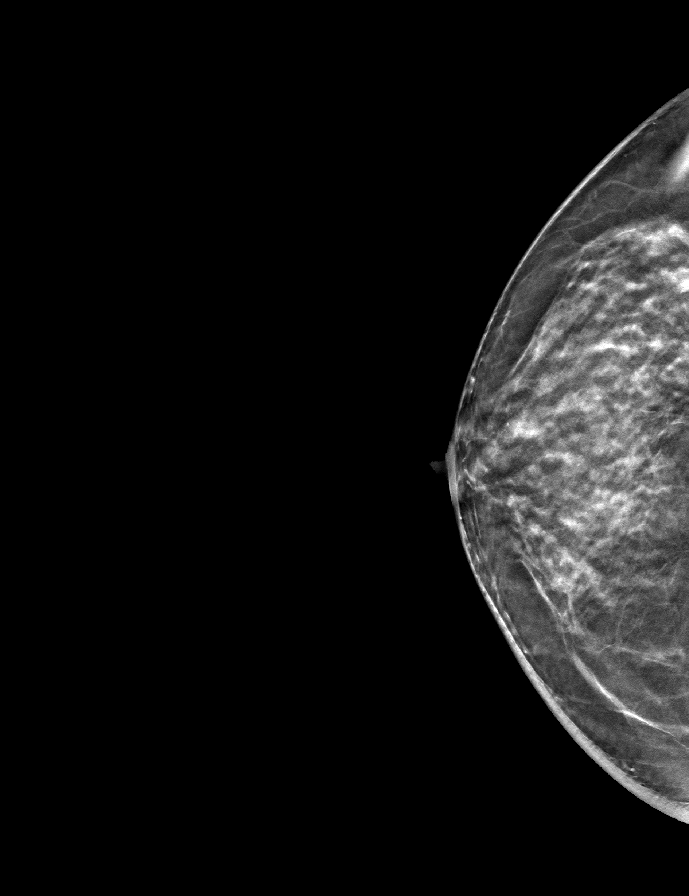
[frame 31/62]
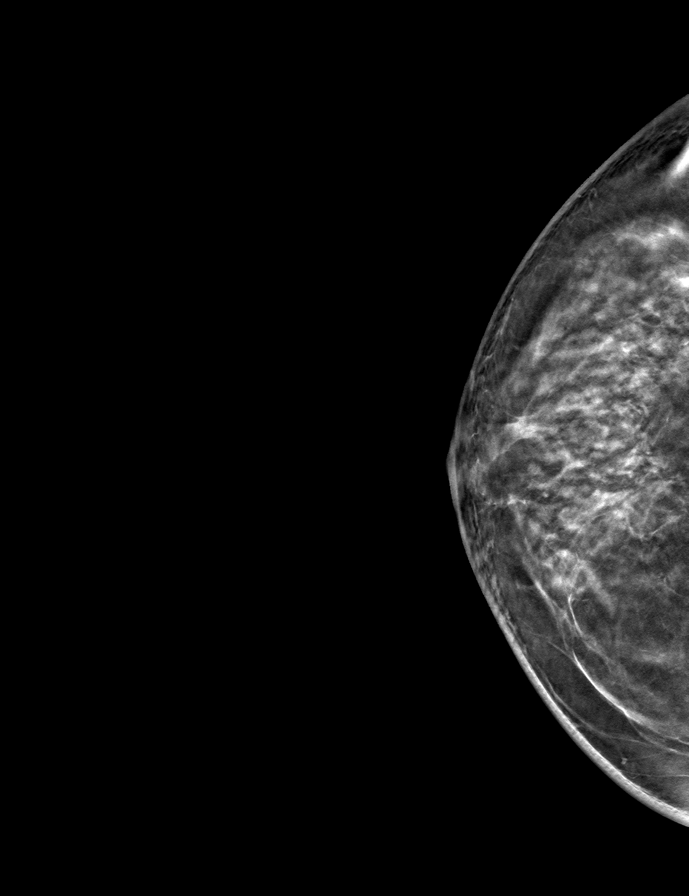

[R MLO tomo · tomo slice 35/69.0]
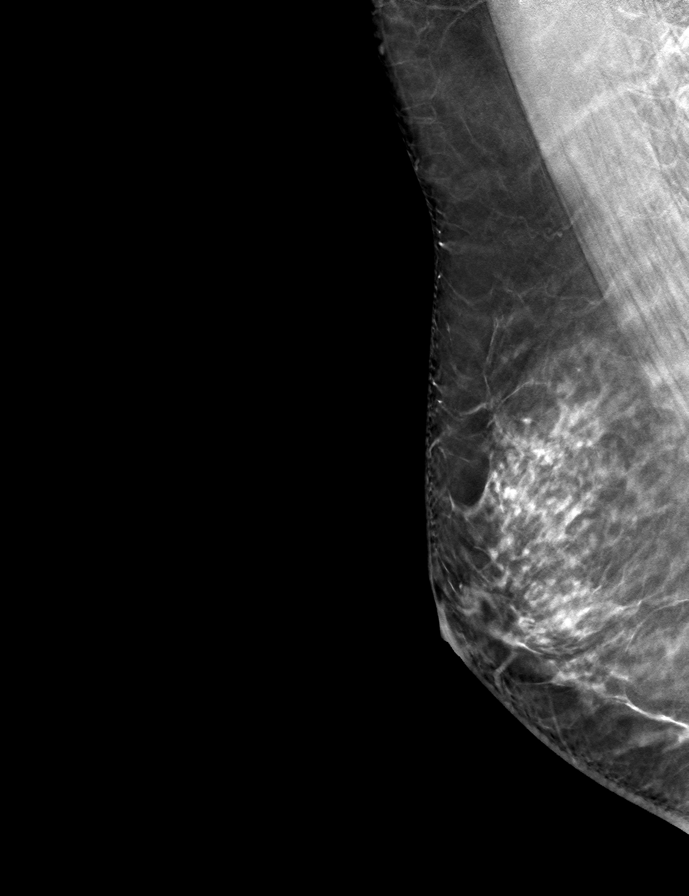

[L MLO tomo · tomo slice 35/69.0]
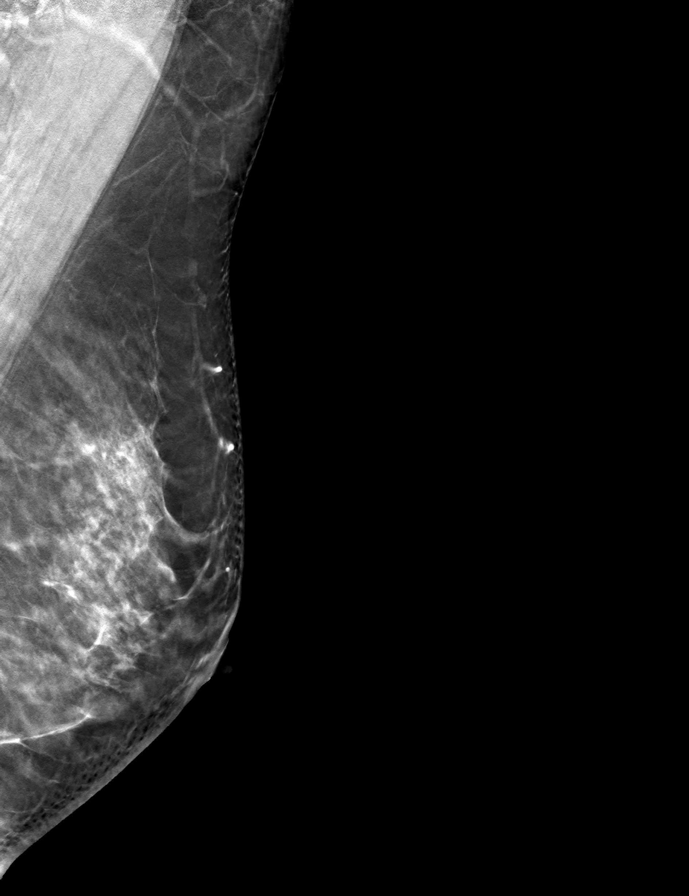

[L CC tomo · tomo slice 33/65.0]
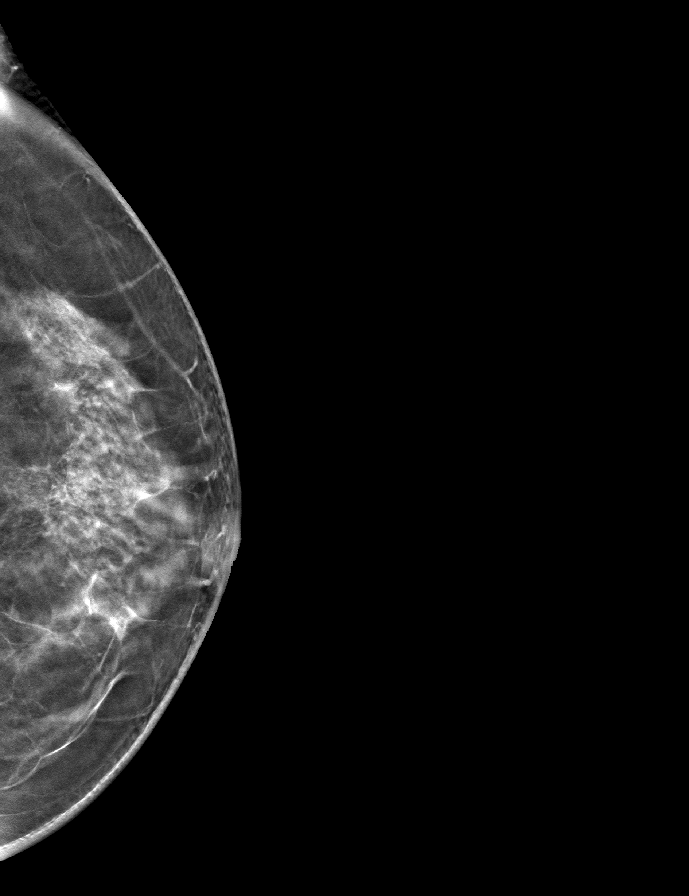

[9 of 24 positions shown; findings below may reference images not displayed]

ACR Breast Density Category c: The breast tissue is heterogeneously
dense, which may obscure small masses.
FINDINGS: There are no findings suspicious for malignancy.
IMPRESSION: No mammographic evidence of malignancy. A result letter of this
screening mammogram will be mailed directly to the patient.

RECOMMENDATION:
Screening mammogram in one year. (Code:Q3-W-BC3)

BI-RADS CATEGORY  1: Negative.

## 2023-05-31 ENCOUNTER — Other Ambulatory Visit: Payer: Self-pay | Admitting: Obstetrics and Gynecology

## 2023-05-31 DIAGNOSIS — Z1231 Encounter for screening mammogram for malignant neoplasm of breast: Secondary | ICD-10-CM

## 2023-07-19 ENCOUNTER — Ambulatory Visit
Admission: RE | Admit: 2023-07-19 | Discharge: 2023-07-19 | Disposition: A | Discharge status: Home / Self Care | Payer: Self-pay | Source: Ambulatory Visit | Attending: Obstetrics and Gynecology | Admitting: Obstetrics and Gynecology

## 2023-07-19 ENCOUNTER — Other Ambulatory Visit: Payer: Self-pay

## 2023-07-19 ENCOUNTER — Ambulatory Visit: Payer: Self-pay | Admitting: Hematology and Oncology

## 2023-07-19 VITALS — BP 120/80 | Wt 167.0 lb

## 2023-07-19 DIAGNOSIS — Z1231 Encounter for screening mammogram for malignant neoplasm of breast: Secondary | ICD-10-CM

## 2023-07-19 DIAGNOSIS — Z1211 Encounter for screening for malignant neoplasm of colon: Secondary | ICD-10-CM

## 2023-07-19 NOTE — Progress Notes (Signed)
 [name] is a 48 y.o. female who presents to [facility] clinic today with no complaints.    Pap Smear: Pap not smear completed today. Last Pap smear was 07/02/2021 and was normal. Per patient has no history of an abnormal Pap smear. Last Pap smear result is available in Epic.   Physical exam: Breasts Breasts symmetrical. No skin abnormalities bilateral breasts. No nipple retraction bilateral breasts. No nipple discharge bilateral breasts. No lymphadenopathy. No lumps palpated bilateral breasts.     MS DIGITAL SCREENING TOMO BILATERAL Result Date: 07/06/2021 CLINICAL DATA:  Screening. EXAM: DIGITAL SCREENING BILATERAL MAMMOGRAM WITH TOMOSYNTHESIS AND CAD TECHNIQUE: Bilateral screening digital craniocaudal and mediolateral oblique mammograms were obtained. Bilateral screening digital breast tomosynthesis was performed. The images were evaluated with computer-aided detection. COMPARISON:  Previous exam(s). ACR Breast Density Category c: The breast tissue is heterogeneously dense, which may obscure small masses. FINDINGS: There are no findings suspicious for malignancy. IMPRESSION: No mammographic evidence of malignancy. A result letter of this screening mammogram will be mailed directly to the patient. RECOMMENDATION: Screening mammogram in one year. (Code:SM-B-01Y) BI-RADS CATEGORY  1: Negative. Electronically Signed   By: Sue Gonzalez M.D.   On: 07/06/2021 07:43   MS DIGITAL SCREENING TOMO BILATERAL Result Date: 07/26/2019 CLINICAL DATA:  Screening. EXAM: DIGITAL SCREENING BILATERAL MAMMOGRAM WITH TOMO AND CAD COMPARISON:  Previous exam(s). ACR Breast Density Category c: The breast tissue is heterogeneously dense, which may obscure small masses. FINDINGS: There are no findings suspicious for malignancy. Images were processed with CAD. IMPRESSION: No mammographic evidence of malignancy. A result letter of this screening mammogram will be mailed directly to the patient. RECOMMENDATION: Screening  mammogram in one year. (Code:SM-B-01Y) BI-RADS CATEGORY  1: Negative. Electronically Signed   By: Sue Gonzalez M.D.   On: 07/26/2019 13:47      Pelvic/Bimanual Pap is not indicated today    Smoking History: Patient has never smoked and was not referred to quit line.    Patient Navigation: Patient education provided. Access to services provided for patient through BCCCP program. Sue Gonzalez interpreter provided. No transportation provided   Colorectal Cancer Screening: Per patient has never had colonoscopy completed No complaints today.    Breast and Cervical Cancer Risk Assessment: Patient does not have family history of breast cancer, known genetic mutations, or radiation treatment to the chest before age 79. Patient does not have history of cervical dysplasia, immunocompromised, or DES exposure in-utero.   Risk Scores as of Encounter on 07/19/2023     Sue Gonzalez           5-year 0.89%   Lifetime 9.46%   This patient is [place]/Latina but has no documented birth country, so the [place] model used data from [place] patients to calculate their risk score. Document a birth country in the Demographics activity for a more accurate score.         Last calculated by Sue Gonzalez, CMA on 07/19/2023 at  2:51 PM         A: BCCCP exam without pap smear No complaints with benign exam.   P: Referred patient to the [facility] of [facility] for a screening mammogram. Appointment scheduled 07/19/2023.  Sue Adjutant, NP 07/19/2023 2:23 PM

## 2023-07-19 NOTE — Patient Instructions (Signed)
 Taught Sue Gonzalez about self breast awareness and gave educational materials to take home. Patient did not need a Pap smear today due to last Pap smear was in 07/02/2021 per patient. Told patient about free cervical cancer screenings to receive a Pap smear if would like one next year. Let her know BCCCP will cover Pap smears every 5 years unless has a history of abnormal Pap smears. Referred patient to the [facility] of [facility] for screening mammogram. Appointment scheduled for 07/19/2023. Patient aware of appointment and will be there. Let patient know will follow up with her within the next couple weeks with results. Enrique Harvest Valencia-Ruiz verbalized understanding.  Adelaide Adjutant, NP 2:24 PM

## 2023-07-21 ENCOUNTER — Encounter: Payer: Self-pay | Admitting: Obstetrics & Gynecology

## 2023-08-11 LAB — FECAL OCCULT BLOOD, IMMUNOCHEMICAL: Fecal Occult Bld: NEGATIVE

## 2023-10-20 ENCOUNTER — Emergency Department (HOSPITAL_COMMUNITY)
Admission: EM | Admit: 2023-10-20 | Discharge: 2023-10-20 | Disposition: A | Discharge status: Home / Self Care | Attending: Emergency Medicine | Admitting: Emergency Medicine

## 2023-10-20 ENCOUNTER — Other Ambulatory Visit: Payer: Self-pay

## 2023-10-20 ENCOUNTER — Encounter (HOSPITAL_COMMUNITY): Payer: Self-pay

## 2023-10-20 ENCOUNTER — Emergency Department (HOSPITAL_COMMUNITY)

## 2023-10-20 DIAGNOSIS — T23362A Burn of third degree of back of left hand, initial encounter: Secondary | ICD-10-CM | POA: Insufficient documentation

## 2023-10-20 DIAGNOSIS — R000 Tachycardia, unspecified: Secondary | ICD-10-CM | POA: Insufficient documentation

## 2023-10-20 DIAGNOSIS — Y990 Civilian activity done for income or pay: Secondary | ICD-10-CM | POA: Insufficient documentation

## 2023-10-20 DIAGNOSIS — Z23 Encounter for immunization: Secondary | ICD-10-CM | POA: Insufficient documentation

## 2023-10-20 DIAGNOSIS — X19XXXA Contact with other heat and hot substances, initial encounter: Secondary | ICD-10-CM | POA: Insufficient documentation

## 2023-10-20 LAB — I-STAT CHEM 8, ED
BUN: 8 mg/dL (ref 6–20)
Calcium, Ion: 1.17 mmol/L (ref 1.15–1.40)
Chloride: 107 mmol/L (ref 98–111)
Creatinine, Ser: 0.6 mg/dL (ref 0.44–1.00)
Glucose, Bld: 98 mg/dL (ref 70–99)
HCT: 39 % (ref 36.0–46.0)
Hemoglobin: 13.3 g/dL (ref 12.0–15.0)
Potassium: 3.3 mmol/L — ABNORMAL LOW (ref 3.5–5.1)
Sodium: 140 mmol/L (ref 135–145)
TCO2: 20 mmol/L — ABNORMAL LOW (ref 22–32)

## 2023-10-20 LAB — HCG, SERUM, QUALITATIVE: Preg, Serum: NEGATIVE

## 2023-10-20 MED ORDER — SILVER SULFADIAZINE 1 % EX CREA
TOPICAL_CREAM | Freq: Once | CUTANEOUS | Status: AC
  Filled 2023-10-20: qty 85

## 2023-10-20 MED ORDER — FENTANYL CITRATE PF 50 MCG/ML IJ SOSY
50.0000 ug | PREFILLED_SYRINGE | Freq: Once | INTRAMUSCULAR | Status: AC
  Administered 2023-10-20: 50 ug via INTRAVENOUS
  Filled 2023-10-20: qty 1

## 2023-10-20 MED ORDER — HYDROMORPHONE HCL 1 MG/ML IJ SOLN
0.5000 mg | Freq: Once | INTRAMUSCULAR | Status: AC
  Administered 2023-10-20: 0.5 mg via INTRAVENOUS
  Filled 2023-10-20: qty 1

## 2023-10-20 MED ORDER — SILVER SULFADIAZINE 1 % EX CREA: 1.0000 | TOPICAL_CREAM | Freq: Every day | CUTANEOUS | 0 refills | Status: AC

## 2023-10-20 MED ORDER — ONDANSETRON HCL 4 MG/2ML IJ SOLN
4.0000 mg | Freq: Once | INTRAMUSCULAR | Status: AC
  Administered 2023-10-20: 4 mg via INTRAVENOUS
  Filled 2023-10-20: qty 2

## 2023-10-20 MED ORDER — OXYCODONE-ACETAMINOPHEN 5-325 MG PO TABS: 1.0000 | ORAL_TABLET | Freq: Four times a day (QID) | ORAL | 0 refills | Status: AC | PRN

## 2023-10-20 MED ORDER — TETANUS-DIPHTH-ACELL PERTUSSIS 5-2.5-18.5 LF-MCG/0.5 IM SUSY
0.5000 mL | PREFILLED_SYRINGE | Freq: Once | INTRAMUSCULAR | Status: AC
  Administered 2023-10-20: 0.5 mL via INTRAMUSCULAR
  Filled 2023-10-20: qty 0.5
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
## 2023-10-20 NOTE — Discharge Instructions (Addendum)
 Lo atendieron hoy por una quemadura en la mano izquierda. Durante su estancia, le monitorizamos las constantes vitales, le realizamos un examen fsico y Sue Gonzalez. Los [employer] tranquilizadores y, por el momento, no hay indicacin para realizar ms pruebas ni intervenciones en urgencias.  Cosas que hacer: - Llame a la clnica de [place] al [phone] al da siguiente para programar una cita de seguimiento. - Acuda a una cita de seguimiento con su mdico de cabecera en las prximas 1 o 2 semanas. - Tome 1 pastilla de Percocet para el dolor moderado o 2 pastillas para el dolor intenso cada 6 horas, segn sea necesario. - Limpie suavemente las heridas de la mano izquierda [address], aplique la crema [place] las vendas una vez al da.  Regrese a urgencias si presenta sntomas nuevos o que empeoran, como aumento de la hinchazn o entumecimiento total de la palma de la mano o los dedos, o si tiene alguna otra inquietud.

## 2023-10-20 NOTE — ED Provider Notes (Signed)
 [place] [facility] DE[facility]MENT AT [facility] Provider Note   CSN: [id number] Arrival date & time: 10/20/23  1454   Patient presents with: Hand Burn (Patient has a burn on her left fingers from a heat press from the [employer])   Sue Gonzalez is a 48 y.o. female with no significant past medical history who presents to the ED for evaluation of burns to the left hand that occurred at work earlier at approximately 2:30pm. Patient was working at the [employer] when her left hand got caught in a hot steam press for appx 10-15 seconds. She reports 9/10 pain currently on her left 1st-4th fingers where the burns are. She is unsure of her last tdap. She has not taken anything for the pain. She denies any flame-burns, inhalation of the steam, or any other injuries.    Allergies: Patient has no known allergies.    Updated Vital Signs BP (!) 129/94   Pulse 66   Temp 98 F (36.7 C) (Oral)   Resp 17   Ht 5' 5 (1.651 m)   Wt 78.5 kg   SpO2 100%   BMI 28.79 kg/m   Physical Exam Vitals reviewed.  Constitutional:      General: She is not in acute distress.    Appearance: She is not toxic-appearing or diaphoretic.  HENT:     Head: Normocephalic and atraumatic.     Nose: Nose normal.     Mouth/Throat:     Mouth: Mucous membranes are moist.     Pharynx: Oropharynx is clear. No oropharyngeal exudate or posterior oropharyngeal erythema.  Eyes:     Extraocular Movements: Extraocular movements intact.     Conjunctiva/sclera: Conjunctivae normal.     Pupils: Pupils are equal, round, and reactive to light.  Cardiovascular:     Rate and Rhythm: Regular rhythm.     Pulses:          Radial pulses are 2+ on the right side and 2+ on the left side.     Heart sounds: No murmur heard.    No gallop.     Comments: Slight tachycardia to low 100s Pulmonary:     Effort: Pulmonary effort is normal. No respiratory distress.     Breath sounds: Normal breath sounds. No wheezing.   Abdominal:     General: Abdomen is flat.     Palpations: Abdomen is soft.     Tenderness: There is no abdominal tenderness. There is no guarding.  Musculoskeletal:     Cervical back: Normal range of motion and neck supple.     Comments: Decreased flexion of the left 1st-4th digits d/t pain and full thickness burns  Skin:    Comments: Full thickness burns to the left 1st-4th digits (see attached photos). Partial thickness burns around the edges of the burn wrapping around sides of the 2nd and 3rd digits but no burning on the palmar surface/no circumferential burns. No sensation of the poorly-blanchable central white areas of the wounds of the 2nd-4th digits. Otherwise no burns or lesions throughout  Neurological:     Mental Status: She is alert and oriented to person, place, and time.     Sensory: Sensory deficit (as above, anesthesia of the left 2nd, 3rd, and 4th dorsal fingers at areas of most severe burns. Otherwise preserved distal sensation of the left fingertips and palmar surface of entire left hand) present.       (all labs ordered are listed, but only abnormal results are displayed) Labs Reviewed  I-STAT CHEM 8, ED - Abnormal; Notable for the following components:      Result Value   Potassium 3.3 (*)    TCO2 20 (*)    All other components within normal limits  HCG, SERUM, QUALITATIVE    EKG: None  Radiology: DG Hand 2 View Left Result Date: 10/20/2023 CLINICAL DATA:  severe L hand burn from steam press EXAM: LEFT HAND - 2 VIEW COMPARISON:  None Available. FINDINGS: No acute fracture or dislocation. There is no evidence of arthropathy or other focal bone abnormality. Soft tissue swelling of the second through fourth digits. No radiopaque foreign body. IMPRESSION: Soft tissue swelling of the second through fourth digits. No acute fracture or dislocation. Electronically Signed   By: Rogelia Myers M.D.   On: 10/20/2023 15:51    Medications Ordered in the ED  fentaNYL   (SUBLIMAZE ) injection 50 mcg (50 mcg Intravenous Given 10/20/23 1528)  Tdap (BOOSTRIX ) injection 0.5 mL (0.5 mLs Intramuscular Given 10/20/23 1530)  HYDROmorphone  (DILAUDID ) injection 0.5 mg (0.5 mg Intravenous Given 10/20/23 1633)  silver  sulfADIAZINE  (SILVADENE ) 1 % cream ( Topical Given 10/20/23 1801)  ondansetron  (ZOFRAN ) injection 4 mg (4 mg Intravenous Given 10/20/23 1801)    Clinical Course as of 10/21/23 0136  Fri Oct 21, 2023  0135 I-stat chem 8, ED (not at [facility], [facility] or [facility])(!) No acute abnormalities aside from mild hypokalemia to 3.3 [AD]  0135 Preg, Serum: NEGATIVE [AD]  0135 DG Hand 2 View Left Soft tissue swelling of the second through fourth digits. No acute fracture or dislocation.   [AD]    Clinical Course User Index [AD] Raoul Rake, MD   Medical Decision Making Patient with no significant PMHx who is right-handed presenting with acute burns to the left dorsal 1st-4th fingers from a hot steam press at her job at the [employer]. On exam the burns are majority full thickness with no sensation at the center of the sounds and sloughing of the skin of the dorsal fingers as shown above. The burns are not circumferential and she has preserved motor function and sensation on the distal fingertips and entire palmar surface of the L hand/fingers. Low TBSA overall so no indication to admit for IV fluids.  Discussed case with burn team at [facility] who recommended no urgent indication for transfer for their immediate evaluation, provided clinic contact info for patient to schedule close f/u and recommended silvadene  cream application daily to the wounds until outpatient follow up.   Checked XR of the L hand which were negative for acute fractures. Patient updated on tdap in ED. She was given fentanyl  and later dilaudid  for pain, and zofran  for nausea after the dilaudid . Her wounds were cleaned and dressed with silvadene  cream and vasoline gauze+kerlix and patient was given supplied for  home wound care. She was also given short course of percocet and strict return precautions.   Amount and/or Complexity of Data Reviewed Labs: ordered. Decision-making details documented in ED Course. Radiology: ordered. Decision-making details documented in ED Course.  Risk Prescription drug management.     Final diagnoses:  Full thickness burn of back of left hand, initial encounter    ED Discharge Orders          Ordered    silver  sulfADIAZINE  (SILVADENE ) 1 % cream  Daily        10/20/23 1718    oxyCODONE -acetaminophen  (PERCOCET/ROXICET) 5-325 MG tablet  Every 6 hours PRN        10/20/23 1718  Raoul Rake, MD 10/21/23 0136    Emil Share, DO 10/21/23 1503

## 2023-11-14 NOTE — Progress Notes (Signed)
 Here for postop F/U after E&G of L fingers #1-4.  Splint & dressing taken down with 99% take.  Limited AROM but good PROM of all joints.  Will dress her in Xeroform, gauze, & net.  To see Therapy today in Clinic.  Custom compression glove not an option at this visit & she is self-pay. Will recommend an Isotoner glove for use after next appointment.   Plan: Dress grafts in Xeroform gauze (yellow color), dry gauze, & net for 7 days. Change Xeroform & dry gauze daily with gentle cleansing using soap & water. Do hand & finger exercises as instructed. Purchase medium Isotoner gloves to bring to your next appointment. Return to [facility] on Mon, 11/28/23.

## 2023-11-18 NOTE — Patient Instructions (Addendum)
 At home:  Please complete the following exercises 3-4 times during the day  Using the opposite hand, please bend the fingers of the left hand downwards so that all three joints are bent.  Hold this position for 15-30 seconds. Do this 10 times in a row.  Using the opposite hand, please bend the thumb joint the is closest to the finger nail downwards and hold for 10 sections.  Do this 10 times in a row.  Using left hand, form a fist so that all fingers are bent and thumb is wrapped around to form a snail shell position.  Hold this for 10-30 seconds, 10 times in a row.

## 2023-11-18 NOTE — Unmapped External Note (Signed)
 Occupational Therapy Burn Clinic Evaluation  Payor: /   Payer/Plan Subscr DOB Sex Relation Sub. Ins. ID Effective Group Num       Referring Diagnosis: Burn   Date of Onset:  10/20/23 Referring Clinician:  Rosabel Agent, MD  Referring Service/Team:  Burn Clinic Demographics:  Age: 48 y.o.  Gender: female   History of Present Illness:  Sue Gonzalez presents to OT as part of a comprehensive burn clinic evaluation.  Current concerns are with regards to decreased AROM of the L hand following a heat press/steam injury.  She most recently underwent E&G of the L hand digits 1-4 on 11/02/23.    ASSESSMENT: Sue Gonzalez presents to this clinic with limitations in AROM of the L hand digits.  She is noted to have decreased active finger flexion and difficulty with formation of a composite fist.  Sue Gonzalez was provided with passive and active range of motion exercises for completion f home to address range of motion concerns.  Passively, she has fairly good range of motion but she would benefit from completion of passive range of motion exercises prior to completion of AROM.  Sue Gonzalez was educated on these exercises today and she was able to demonstrate independent completion of these.  Please see patient instructions section for details.    If needed, Sue Gonzalez may benefit from a referral to outpatient OT should concerns persist.  At this time and due to her self pay status we will have her complete exercises at home prior to starting a formal therapy on an outpatient basis.    Hand Dominance:  Right   Significant Past Medical History:  Medical History[1] No additional contributory medical history.  Please see Burn Clinic note from Dr. Rosabel from this date for additional details. Concurrent Services: None Therapy within Past Year: No  Medications:  Current Medications[2]  Allergies:   Allergies as of 11/14/2023  . (No Known Allergies)     Rehabilitation Precautions/Restrictions:    Precautions/Restrictions Precautions: None Restrictions: None     Fall Risk Screen:Subjective Fall History Fall in the last year?: No Feel unsteady or off balance?: No    SUBJECTIVE Sue Gonzalez reports she has been having some pain in her hand.  Reports that her main concern is with regards to range of motion of the hand.   Current Functional Limitations: Decreased AROM of the L hand. Patient Goals:  To return to baseline function. Pain:  Pain Assessment Pain Score  : 5  Equipment Owned: Will be purchasing an Building surveyor.   OBJECTIVE  General Observation:  Sue Gonzalez participated well during this evaluation.  Skin Condition: Healing post operative areas noted.   Sensation: UTA based on injury.  Range of Motion and Strength:  Has full passive range of motion of all digits with some decreased tolerance secondary to pain.  Slight overpressure needed to achieve full range.  Demonstrates approximately 70% of a composite fist actively.   Outcomes:  Patient-Specific Functional Scale (PSFS) Activity 1: Full movement of hand Answer: 4 Sum of Activity Scores: 4 Number of Activities: 1 Patient-Specific Functional Scale Total Score: 4     Interventions: Therapeutic Exercises: Established HEP to address A/PROM of the L hand digits with a focus on return of a full composite fist.  Patient was educated on completion of these exercises with the patient demonstrating independent performance of all exercises.  Please see patient instructions section for details.(10 minutes)    Education:    Yes, provided as follows:   Barriers to Learning:  No  Barriers   Learning Needs:  HEP    Education Provided: Per learning needs listed above   Audience / Response:  Patient and family     Mode:  Explanation and Demonstration   Interpreter Utilized: Interpreter number [id number]   Response: Applied knowledge, Verbalized understanding, and Demonstrated skill   Therapy Diagnosis:     ICD-10-CM   1. Burn   T30.0    Problem List: Assessment Impairment List: Edema; Upper extremity range of motion; Pain limiting function/lifestyle    Other Rehabilitation Considerations:  Self pay status.  Response to Evaluation: The session was tolerated well, as evidenced by appropriate participation and understanding demonstrated.  Rehabilitation Potential:    Motivation/Commitment to Therapy: Good  Rehabilitation Potential:  Good   Support Structure:  Good  Family member willing to assist  Goals:   Goals Addressed             This Visit's Progress   . OT Goal       Goal 1 - Sue Gonzalez will be provided with an HEP to address AROM of the hand and digits.  Baseline: This was provided during the evaluation.        PLAN Treatment Frequency and Duration:  Treatment Plan Details: One appointment only while in burn clinic.  Recommended OT Treatment/Interventions: Therapeutic exercise (02889)  Necessity: Required to return to Premorbid environment (or reside in new living environment)?  No Required to reduce ADL or IADL assistance to Premorbid level?  Yes  Recommended Consults: None currently  Development of Plan of Care: Patient participated in plan of care development.  Total Treatment Time (Time & Untimed): OT Total Treatment Time: 20 Total Time in Timed Codes: OT Total Timed Code Minutes: 10 OT Eval Low Complexity minutes: 10   Treatment/Procedures Therapeutic Exercises minutes: 10       OT Eval Complexity: Occupational Profile/Medical and Therapy History: Brief history including review of medical and/or therapy records relating to the presenting problem Therapy Evaluation Assessment: 1-3 performance deficits relating to physical, cognitive, or psychosocial limitations/restrictions Clinical Decision Making: Low complexity, limited amount of treatment options, no assessment modification, no comorbidities OT Eval Complexity Score: Low Complexity       The patient has been instructed  to contact our clinic if any questions or problems should arise.  Charges           11/18/2023   None        Occupational Profile and History: Brief history taking required Assessment/Performance Deficits:     Decision Making (based on functional outcome measure/patient assessment instrument): Low complexity       [1] No past medical history on file. [2]  Current Outpatient Medications:  .  acetaminophen  (TYLENOL ) 500 mg tablet, Take 1,000 mg by mouth every 6 (six) hours as needed for mild pain (1-3)., Disp: , Rfl:

## 2023-11-25 ENCOUNTER — Encounter: Payer: Self-pay | Admitting: *Deleted

## 2023-11-25 NOTE — Congregational Nurse Program (Signed)
  Dept: [phone]   Congregational Nurse Program Note  Date of Encounter: 11/25/2023  Past Medical History: No past medical history on file.  Encounter Details:  Community Questionnaire - 11/25/23 1613       Questionnaire   Ask client: Do you give verbal consent for me to treat you today? Yes    Student Assistance N/A    Location Patient Served  [facility]    Encounter Setting CN site    Population Status Migrant/Refugee    Insurance Uninsured ([employer]s/Self-Pay/Medicaid Family Planning)    Insurance/Financial Assistance Referral [facility] Financial Assistance    Medication Have Medication Insecurities    Medical Provider No    Screening Referrals Made N/A    Medical Referrals Made N/A    Medical Appointment Completed N/A    CNP Interventions Reviewed New Diagnosis;Advocate/Support;Counsel;Educate    Screenings CN Performed N/A    ED Visit Averted Yes    Life-Saving Intervention Made N/A         Client was referred from [facility].  Client came into nurse office with bill from [facility].  Called patient accounting to discuss bill.  Called Med assist and first source and left messages.  Received call back and representative screened client for [facility] emergency services.  Client is now being referred for [facility] financial assistance program.  Client requested CN assistance to redress her left hand fingers and thumb.  Client has healing skin grafts to fingers of left hand.  Assisted to wrap 2 inch gauze bandage around fingers and used paper tape to secure wraps.  Client happy with dressings.  Client to return to see this CN on Tuesday at 2 pm to assist with changing dressings to fingers.  Lugene Ropes, RN, MSN, CNP [phone] Office [phone] Cell

## 2023-11-28 NOTE — Progress Notes (Signed)
 Contact note: patient seen briefly for clarification of previous exercises given during OT evaluation.  Patient aware of exercises and reminded of the importance of routine carry over.  In addition, patient provided with clarification on the recommendation of an Isotoner glove to provide compression to injured hand with message also sent via [employer] account. At this time, no further evaluation needed.  Patient may benefit from additional referral to OP OT if concerns persist.  Referral provided by PA today.
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
## 2023-11-29 ENCOUNTER — Encounter: Payer: Self-pay | Admitting: *Deleted
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
